# Patient Record
Sex: Female | Born: 1990 | Race: White | Hispanic: No | Marital: Married | Smoking: Former smoker
Health system: Southern US, Community
[De-identification: ages and names within clinical notes are randomized; demographics above are authoritative.]

## PROBLEM LIST (undated history)

## (undated) ENCOUNTER — Inpatient Hospital Stay (HOSPITAL_COMMUNITY): Payer: Self-pay

## (undated) DIAGNOSIS — E282 Polycystic ovarian syndrome: Secondary | ICD-10-CM

## (undated) DIAGNOSIS — F419 Anxiety disorder, unspecified: Secondary | ICD-10-CM

## (undated) DIAGNOSIS — D649 Anemia, unspecified: Secondary | ICD-10-CM

## (undated) DIAGNOSIS — Z8759 Personal history of other complications of pregnancy, childbirth and the puerperium: Secondary | ICD-10-CM

## (undated) DIAGNOSIS — E119 Type 2 diabetes mellitus without complications: Secondary | ICD-10-CM

## (undated) DIAGNOSIS — L732 Hidradenitis suppurativa: Secondary | ICD-10-CM

## (undated) DIAGNOSIS — E559 Vitamin D deficiency, unspecified: Secondary | ICD-10-CM

## (undated) DIAGNOSIS — I10 Essential (primary) hypertension: Secondary | ICD-10-CM

## (undated) DIAGNOSIS — K219 Gastro-esophageal reflux disease without esophagitis: Secondary | ICD-10-CM

## (undated) HISTORY — DX: Vitamin D deficiency, unspecified: E55.9

## (undated) HISTORY — DX: Type 2 diabetes mellitus without complications: E11.9

## (undated) HISTORY — DX: Gastro-esophageal reflux disease without esophagitis: K21.9

## (undated) HISTORY — PX: CHOLECYSTECTOMY: SHX55

## (undated) HISTORY — DX: Hidradenitis suppurativa: L73.2

## (undated) HISTORY — DX: Polycystic ovarian syndrome: E28.2

## (undated) HISTORY — DX: Essential (primary) hypertension: I10

## (undated) HISTORY — DX: Personal history of other complications of pregnancy, childbirth and the puerperium: Z87.59

## (undated) HISTORY — DX: Anemia, unspecified: D64.9

---

## 2018-01-11 ENCOUNTER — Other Ambulatory Visit: Payer: Self-pay

## 2018-01-11 ENCOUNTER — Emergency Department (HOSPITAL_COMMUNITY)
Admission: EM | Admit: 2018-01-11 | Discharge: 2018-01-11 | Disposition: A | Discharge status: Home / Self Care | Payer: BLUE CROSS/BLUE SHIELD | Attending: Emergency Medicine | Admitting: Emergency Medicine

## 2018-01-11 ENCOUNTER — Encounter (HOSPITAL_COMMUNITY): Payer: Self-pay | Admitting: Emergency Medicine

## 2018-01-11 DIAGNOSIS — F17210 Nicotine dependence, cigarettes, uncomplicated: Secondary | ICD-10-CM | POA: Insufficient documentation

## 2018-01-11 DIAGNOSIS — L02415 Cutaneous abscess of right lower limb: Secondary | ICD-10-CM | POA: Insufficient documentation

## 2018-01-11 DIAGNOSIS — N764 Abscess of vulva: Secondary | ICD-10-CM | POA: Insufficient documentation

## 2018-01-11 DIAGNOSIS — R2241 Localized swelling, mass and lump, right lower limb: Secondary | ICD-10-CM | POA: Diagnosis present

## 2018-01-11 DIAGNOSIS — L0291 Cutaneous abscess, unspecified: Secondary | ICD-10-CM

## 2018-01-11 LAB — CBG MONITORING, ED: GLUCOSE-CAPILLARY: 84 mg/dL (ref 65–99)

## 2018-01-11 MED ORDER — FLUCONAZOLE 100 MG PO TABS
200.0000 mg | ORAL_TABLET | Freq: Once | ORAL | Status: AC
  Administered 2018-01-11: 200 mg via ORAL
  Filled 2018-01-11: qty 2

## 2018-01-11 MED ORDER — DOXYCYCLINE HYCLATE 100 MG PO TABS
100.0000 mg | ORAL_TABLET | Freq: Once | ORAL | Status: AC
  Administered 2018-01-11: 100 mg via ORAL
  Filled 2018-01-11: qty 1

## 2018-01-11 MED ORDER — TRAMADOL HCL 50 MG PO TABS
100.0000 mg | ORAL_TABLET | Freq: Once | ORAL | Status: AC
  Administered 2018-01-11: 100 mg via ORAL
  Filled 2018-01-11: qty 2

## 2018-01-11 MED ORDER — DOXYCYCLINE HYCLATE 100 MG PO CAPS: 100.0000 mg | ORAL_CAPSULE | Freq: Two times a day (BID) | ORAL | 0 refills | Status: DC

## 2018-01-11 MED ORDER — CEPHALEXIN 500 MG PO CAPS: 500.0000 mg | ORAL_CAPSULE | Freq: Four times a day (QID) | ORAL | 0 refills | Status: DC

## 2018-01-11 MED ORDER — TRAMADOL HCL 50 MG PO TABS: 50.0000 mg | ORAL_TABLET | Freq: Four times a day (QID) | ORAL | 0 refills | Status: DC | PRN

## 2018-01-11 MED ORDER — PROMETHAZINE HCL 12.5 MG PO TABS
12.5000 mg | ORAL_TABLET | Freq: Once | ORAL | Status: AC
  Administered 2018-01-11: 12.5 mg via ORAL
  Filled 2018-01-11: qty 1

## 2018-01-11 NOTE — ED Provider Notes (Signed)
[facility] EMERGENCY DEPARTMENT Provider Note   CSN: [id number] Arrival date & time: 01/11/18  1855     History   Chief Complaint Chief Complaint  Patient presents with  . Abscess    HPI Candice SidleChristine Campos is a 27 y.o. female.  Patient is a 27 year old female who presents to the emergency department with a complaint of multiple abscess areas.  Patient states she is been having a problem with abscess/boil/cyst for over 2 years.  They have been worse in the last few months.  She presents to the emergency department now because she says the pain is getting worse, and even interfering with her walking.  She has been evaluated by her primary physician as well as her GYN, and they have given her instructions to use an antibiotic soap.  She states that they have not given her any information about why she gets them, and she says the soap does not work.  She notices these lesions on her legs, arms private area, and on the side of her abdomen.  She denies history of diabetes.  She denies ever being told that she had methicillin-resistant staph growth.  She has not had any fever, she has not had chills, she has not had vomiting.  She presents now for assistance with this issue.   The history is provided by the patient.    History reviewed. No pertinent past medical history.  There are no active problems to display for this patient.   Past Surgical History:  Procedure Laterality Date  . CHOLECYSTECTOMY      OB History    Gravida Para Term Preterm AB Living   0 0 0 0 0 0   SAB TAB Ectopic Multiple Live Births   0 0 0 0 0       Home Medications    Prior to Admission medications   Not on File    Family History History reviewed. No pertinent family history.  Social History Social History   Tobacco Use  . Smoking status: Current Every Day Smoker    Packs/day: 0.50    Types: Cigarettes  . Smokeless tobacco: Never Used  Substance Use Topics  . Alcohol use: No   Frequency: Never  . Drug use: No     Allergies   Amoxicillin   Review of Systems Review of Systems  Constitutional: Negative for activity change, chills and fever.       All ROS Neg except as noted in HPI  HENT: Negative for nosebleeds.   Eyes: Negative for photophobia and discharge.  Respiratory: Negative for cough, shortness of breath and wheezing.   Cardiovascular: Negative for chest pain and palpitations.  Gastrointestinal: Negative for abdominal pain and blood in stool.  Genitourinary: Negative for dysuria, frequency and hematuria.  Musculoskeletal: Negative for arthralgias, back pain and neck pain.  Skin: Positive for rash.       Skin lesions  Neurological: Negative for dizziness, seizures and speech difficulty.  Psychiatric/Behavioral: Negative for confusion and hallucinations.     Physical Exam Updated Vital Signs BP 135/75 (BP Location: Right Arm)   Pulse 91   Temp 98.3 F (36.8 C) (Oral)   Resp 14   Ht 5\' 8"  (1.727 m)   Wt (!) 136.5 kg (301 lb)   SpO2 99%   BMI 45.77 kg/m   Physical Exam  Constitutional: She is oriented to person, place, and time. She appears well-developed and well-nourished.  Non-toxic appearance.  HENT:  Head: Normocephalic.  Right Ear: Tympanic  membrane and external ear normal.  Left Ear: Tympanic membrane and external ear normal.  Eyes: EOM and lids are normal. Pupils are equal, round, and reactive to light.  Neck: Normal range of motion. Neck supple. Carotid bruit is not present.  Cardiovascular: Normal rate, regular rhythm, normal heart sounds, intact distal pulses and normal pulses.  Pulmonary/Chest: Breath sounds normal. No respiratory distress.  Abdominal: Soft. Bowel sounds are normal. There is no tenderness. There is no guarding.  Genitourinary:  Genitourinary Comments: Chaperone present during the examination.  The patient has lesions on the right side of the upper thigh.  There are multiple on the pubis and vulva area.  Some  of them are draining purulent material.  There are 3 raised areas that appear to be healing on the inner aspect of the left thigh, and one in which the top is denuded..  There are no red streaks appreciated.  All of these areas are tender to touch.  Musculoskeletal: Normal range of motion.  Lymphadenopathy:       Head (right side): No submandibular adenopathy present.       Head (left side): No submandibular adenopathy present.    She has no cervical adenopathy.  Neurological: She is alert and oriented to person, place, and time. She has normal strength. No cranial nerve deficit or sensory deficit.  Skin: Skin is warm and dry.  Psychiatric: She has a normal mood and affect. Her speech is normal.  Nursing note and vitals reviewed.    ED Treatments / Results  Labs (all labs ordered are listed, but only abnormal results are displayed) Labs Reviewed - No data to display  EKG  EKG Interpretation None       Radiology No results found.  Procedures Procedures (including critical care time)  Medications Ordered in ED Medications - No data to display   Initial Impression / Assessment and Plan / ED Course  I have reviewed the triage vital signs and the nursing notes.  Pertinent labs & imaging results that were available during my care of the patient were reviewed by me and considered in my medical decision making (see chart for details).       Final Clinical Impressions(s) / ED Diagnoses MDM  Vital signs reviewed.  Patient is noted to have multiple abscess areas.  Some of them denuded, and some of them draining.  Patient denies recent fever or chills.  She denies history of diabetes or any other medical condition other than high blood pressure.  Glucose was obtained and found to be within normal limits.  The plan at this time is for the patient to use warm Epson salt soaks.  She is placed on doxycycline and Keflex.  Patient also given medication to assist with her pain and  discomfort.  The patient is referred to Ms. Register for dermatologic evaluation.  Patient is in agreement with this plan.   Final diagnoses:  Abscess of multiple sites    ED Discharge Orders        Ordered    doxycycline (VIBRAMYCIN) 100 MG capsule  2 times daily     01/11/18 2024    cephALEXin (KEFLEX) 500 MG capsule  4 times daily     01/11/18 2024    traMADol (ULTRAM) 50 MG tablet  Every 6 hours PRN     01/11/18 2026       Ivery Quale, PA-C 01/12/18 1610    Bethann Berkshire, MD 01/12/18 1456

## 2018-01-11 NOTE — ED Triage Notes (Signed)
PT c/o multiple raised painful areas to inner legs and pelvic area over the past 2 months. PT states she has seen 2 physicians about the areas and was told to wash with antibacterial soap and it has not been effective.

## 2018-01-11 NOTE — Discharge Instructions (Signed)
Please soak in a tub of warm Epson salt water for 10-15 minutes daily until this problem has resolved.  Please use Keflex with breakfast, lunch, dinner, and at bedtime.  Please use doxycycline 2 times daily with food.  Use Tylenol, and/or ibuprofen for soreness.  May use Ultram for more severe soreness. This medication may cause drowsiness. Please do not drink, drive, or participate in activity that requires concentration while taking this medication.  Please see Ms. Register for dermatology evaluation concerning your lesions.

## 2018-01-15 LAB — AEROBIC CULTURE  (SUPERFICIAL SPECIMEN)
CULTURE: NORMAL
SPECIAL REQUESTS: NORMAL

## 2018-01-15 LAB — AEROBIC CULTURE W GRAM STAIN (SUPERFICIAL SPECIMEN)

## 2018-01-16 ENCOUNTER — Telehealth: Payer: Self-pay | Admitting: Emergency Medicine

## 2018-01-16 NOTE — Telephone Encounter (Signed)
Post ED Visit - Positive Culture Follow-up  Culture report reviewed by antimicrobial stewardship pharmacist:  [x]  Enzo BiNathan Batchelder, Pharm.D. []  Celedonio MiyamotoJeremy Frens, Pharm.D., BCPS AQ-ID []  Garvin FilaMike Maccia, Pharm.D., BCPS []  Georgina PillionElizabeth Martin, [address] BCPS []  [place]es[place]Minh Pham, [address]Pharm[address]D., BCPS, AAHIVP []  Estella HuskMichelle Turner, Pharm.D., BCPS, AAHIVP []  Lysle Pearlachel Rumbarger, PharmD, BCPS []  Blake DivineShannon Parkey, PharmD []  Pollyann SamplesAndy Johnston, PharmD, BCPS  Positive wound culture Treated with doxycycline and cephalexin, organism sensitive to the same and no further patient follow-up is required at this time.  Berle MullMiller, Elayah Klooster 01/16/2018, 11:23 AM

## 2018-03-27 ENCOUNTER — Ambulatory Visit: Payer: Self-pay | Admitting: Family Medicine

## 2018-03-28 ENCOUNTER — Emergency Department (HOSPITAL_COMMUNITY): Payer: BLUE CROSS/BLUE SHIELD

## 2018-03-28 ENCOUNTER — Emergency Department (HOSPITAL_COMMUNITY)
Admission: EM | Admit: 2018-03-28 | Discharge: 2018-03-28 | Disposition: A | Discharge status: Home / Self Care | Payer: BLUE CROSS/BLUE SHIELD | Attending: Emergency Medicine | Admitting: Emergency Medicine

## 2018-03-28 DIAGNOSIS — J069 Acute upper respiratory infection, unspecified: Secondary | ICD-10-CM | POA: Diagnosis not present

## 2018-03-28 DIAGNOSIS — Z79899 Other long term (current) drug therapy: Secondary | ICD-10-CM | POA: Insufficient documentation

## 2018-03-28 DIAGNOSIS — R05 Cough: Secondary | ICD-10-CM | POA: Diagnosis present

## 2018-03-28 DIAGNOSIS — F17210 Nicotine dependence, cigarettes, uncomplicated: Secondary | ICD-10-CM | POA: Diagnosis not present

## 2018-03-28 DIAGNOSIS — B9789 Other viral agents as the cause of diseases classified elsewhere: Secondary | ICD-10-CM

## 2018-03-28 LAB — GROUP A STREP BY PCR: GROUP A STREP BY PCR: NOT DETECTED

## 2018-03-28 MED ORDER — ACETAMINOPHEN 325 MG PO TABS
650.0000 mg | ORAL_TABLET | Freq: Once | ORAL | Status: AC
  Administered 2018-03-28: 650 mg via ORAL
  Filled 2018-03-28: qty 2

## 2018-03-28 MED ORDER — GUAIFENESIN-CODEINE 100-10 MG/5ML PO SYRP: 5.0000 mL | ORAL_SOLUTION | Freq: Three times a day (TID) | ORAL | 0 refills | Status: DC | PRN

## 2018-03-28 NOTE — ED Triage Notes (Signed)
Pt complains of shortness of breath and sore throat since yesterday, cough and pain in abdomen since this morning.

## 2018-03-28 NOTE — ED Provider Notes (Signed)
[facility]-EMERGENCY DEPT Provider Note   CSN: [id number] Arrival date & time: 03/28/18  0707     History   Chief Complaint Chief Complaint  Patient presents with  . Shortness of Breath  . Cough    HPI Candice Campos is a 27 y.o. female.  HPI   Candice Campos is a 27 year old female with a history of hypertension who presents to the emergency department for evaluation of cough, sore throat and bilateral anterior rib pain.  Patient reports that her symptoms began yesterday.  Reports that her cough is nonproductive.  Feels like she has been wheezing.  States that she has bilateral lower rib pain with cough and deep inspiration.  Reports that she feels short of breath with coughing.  She also reports that she developed a sore throat which is about a 5/10 in severity and feels scratchy in nature.  Pain is worsened with coughing or swallowing.  She states that she is felt hot and cold, but denies measured fever.  She denies sick contacts with similar symptoms.  Tried taking DayQil last night without improvement.  She denies congestion, rhinorrhea, headache, ear pain, neck pain/stiffness, dysphasia, abdominal pain, nausea/vomiting, diarrhea, chest pain.  No history of DVT/PE, recent surgery or immobilization, exogenous estrogen or leg swelling/calf tenderness.   No past medical history on file.  There are no active problems to display for this patient.   Past Surgical History:  Procedure Laterality Date  . CHOLECYSTECTOMY       OB History    Gravida  0   Para  0   Term  0   Preterm  0   AB  0   Living  0     SAB  0   TAB  0   Ectopic  0   Multiple  0   Live Births  0            Home Medications    Prior to Admission medications   Medication Sig Start Date End Date Taking? Authorizing Provider  cephALEXin (KEFLEX) 500 MG capsule Take 1 capsule (500 mg total) by mouth 4 (four) times daily. 01/11/18   Ivery Quale, PA-C  doxycycline  (VIBRAMYCIN) 100 MG capsule Take 1 capsule (100 mg total) by mouth 2 (two) times daily. 01/11/18   Ivery Quale, PA-C  traMADol (ULTRAM) 50 MG tablet Take 1 tablet (50 mg total) by mouth every 6 (six) hours as needed. 01/11/18   Ivery Quale, PA-C    Family History No family history on file.  Social History Social History   Tobacco Use  . Smoking status: Current Every Day Smoker    Packs/day: 0.50    Types: Cigarettes  . Smokeless tobacco: Never Used  Substance Use Topics  . Alcohol use: No    Frequency: Never  . Drug use: No     Allergies   Amoxicillin   Review of Systems Review of Systems  Constitutional: Negative for chills and fever.  HENT: Positive for sore throat. Negative for congestion, ear pain, rhinorrhea and trouble swallowing.   Respiratory: Positive for cough, shortness of breath and wheezing.   Cardiovascular: Positive for chest pain (anterior lower chest wall pain with cough).  Gastrointestinal: Negative for abdominal pain, nausea and vomiting.  Genitourinary: Negative for dysuria.  Musculoskeletal: Negative for arthralgias.  Skin: Negative for rash.  Neurological: Negative for headaches.  Psychiatric/Behavioral: Negative for agitation.     Physical Exam Updated Vital Signs BP 127/78   Pulse 91  Temp 98.1 F (36.7 C) (Oral)   Resp 18   LMP 03/20/2018   SpO2 97%   Physical Exam  Constitutional: She appears well-developed and well-nourished. No distress.  HENT:  Head: Normocephalic and atraumatic.  Posterior oropharynx appears erythematous.  Bilateral 2+ tonsillar edema, no exudate.  Uvula midline.  Airway patent and able to handle oral secretions.  Clear rhinorrhea noted in bilateral nares.  Tender bilateral anterior cervical chain adenopathy.  Eyes: Pupils are equal, round, and reactive to light. Conjunctivae are normal. Right eye exhibits no discharge. Left eye exhibits no discharge.  Neck: Normal range of motion. Neck supple.    Cardiovascular: Normal rate, regular rhythm and intact distal pulses. Exam reveals no friction rub.  No murmur heard. Pulmonary/Chest: Effort normal and breath sounds normal. No stridor. No respiratory distress. She has no wheezes. She has no rales.  Tender to palpation over bilateral lower ribs as depicted in image.  No overlying erythema or rash.  No respiratory distress, speaking in full sentences.  Dry cough present.  Good air movement.  No wheezes, rales or rhonchi.    Abdominal: Soft. Bowel sounds are normal. There is no tenderness.  Musculoskeletal:  No leg swelling or calf tenderness.  Neurological: She is alert. Coordination normal.  Skin: Skin is warm and dry. Capillary refill takes less than 2 seconds. She is not diaphoretic.  Psychiatric: She has a normal mood and affect. Her behavior is normal.  Nursing note and vitals reviewed.    ED Treatments / Results  Labs (all labs ordered are listed, but only abnormal results are displayed) Labs Reviewed  GROUP A STREP BY PCR    EKG None  Radiology No results found.  Procedures Procedures (including critical care time)  Medications Ordered in ED Medications  acetaminophen (TYLENOL) tablet 650 mg (650 mg Oral Given 03/28/18 0751)     Initial Impression / Assessment and Plan / ED Course  I have reviewed the triage vital signs and the nursing notes.  Pertinent labs & imaging results that were available during my care of the patient were reviewed by me and considered in my medical decision making (see chart for details).    Chest x-ray negative for acute abnormality.  Rapid strep test negative.  Patient's symptoms are consistent with viral URI.  In terms of her bilateral lower anterior chest wall pain, this is reproducible to palpation and is likely MSK related to her recent cough.  No abdominal tenderness on exam.  Patient is PERC negative and do not suspect PE. Discussed that antibiotics are not indicated for viral  infections. Pt will be discharged with symptomatic treatment. Patient verbalizes understanding and is agreeable with plan. Discussed reasons to return to the ED and she agrees. Pt is hemodynamically stable & in NAD prior to dc.  Final Clinical Impressions(s) / ED Diagnoses   Final diagnoses:  Viral URI with cough    ED Discharge Orders        Ordered    guaiFENesin-codeine [facility] AC) 100-10 MG/5ML syrup  3 times daily PRN     03/28/18 0825       Kellie Shropshire, PA-C 03/28/18 1914    Derwood Kaplan, MD 03/28/18 [phone]

## 2018-03-28 NOTE — Discharge Instructions (Addendum)
Your chest x-ray was reassuring.  You do not have strep throat.  I have written you a prescription for cough medicine with codeine.  This medicine can make you drowsy so please do not drive or work while taking it.  Please take Tylenol for the pain over your ribs.  Return to the ER if you have any new or concerning symptoms like not being able to catch your breath.

## 2020-03-11 IMAGING — CR DG CHEST 2V
2 series · 2 of 2 positions shown · non-contrast
Comparison: None.

CLINICAL DATA: Cough and congestion

EXAM:
CHEST - 2 VIEW

[w chest pa]
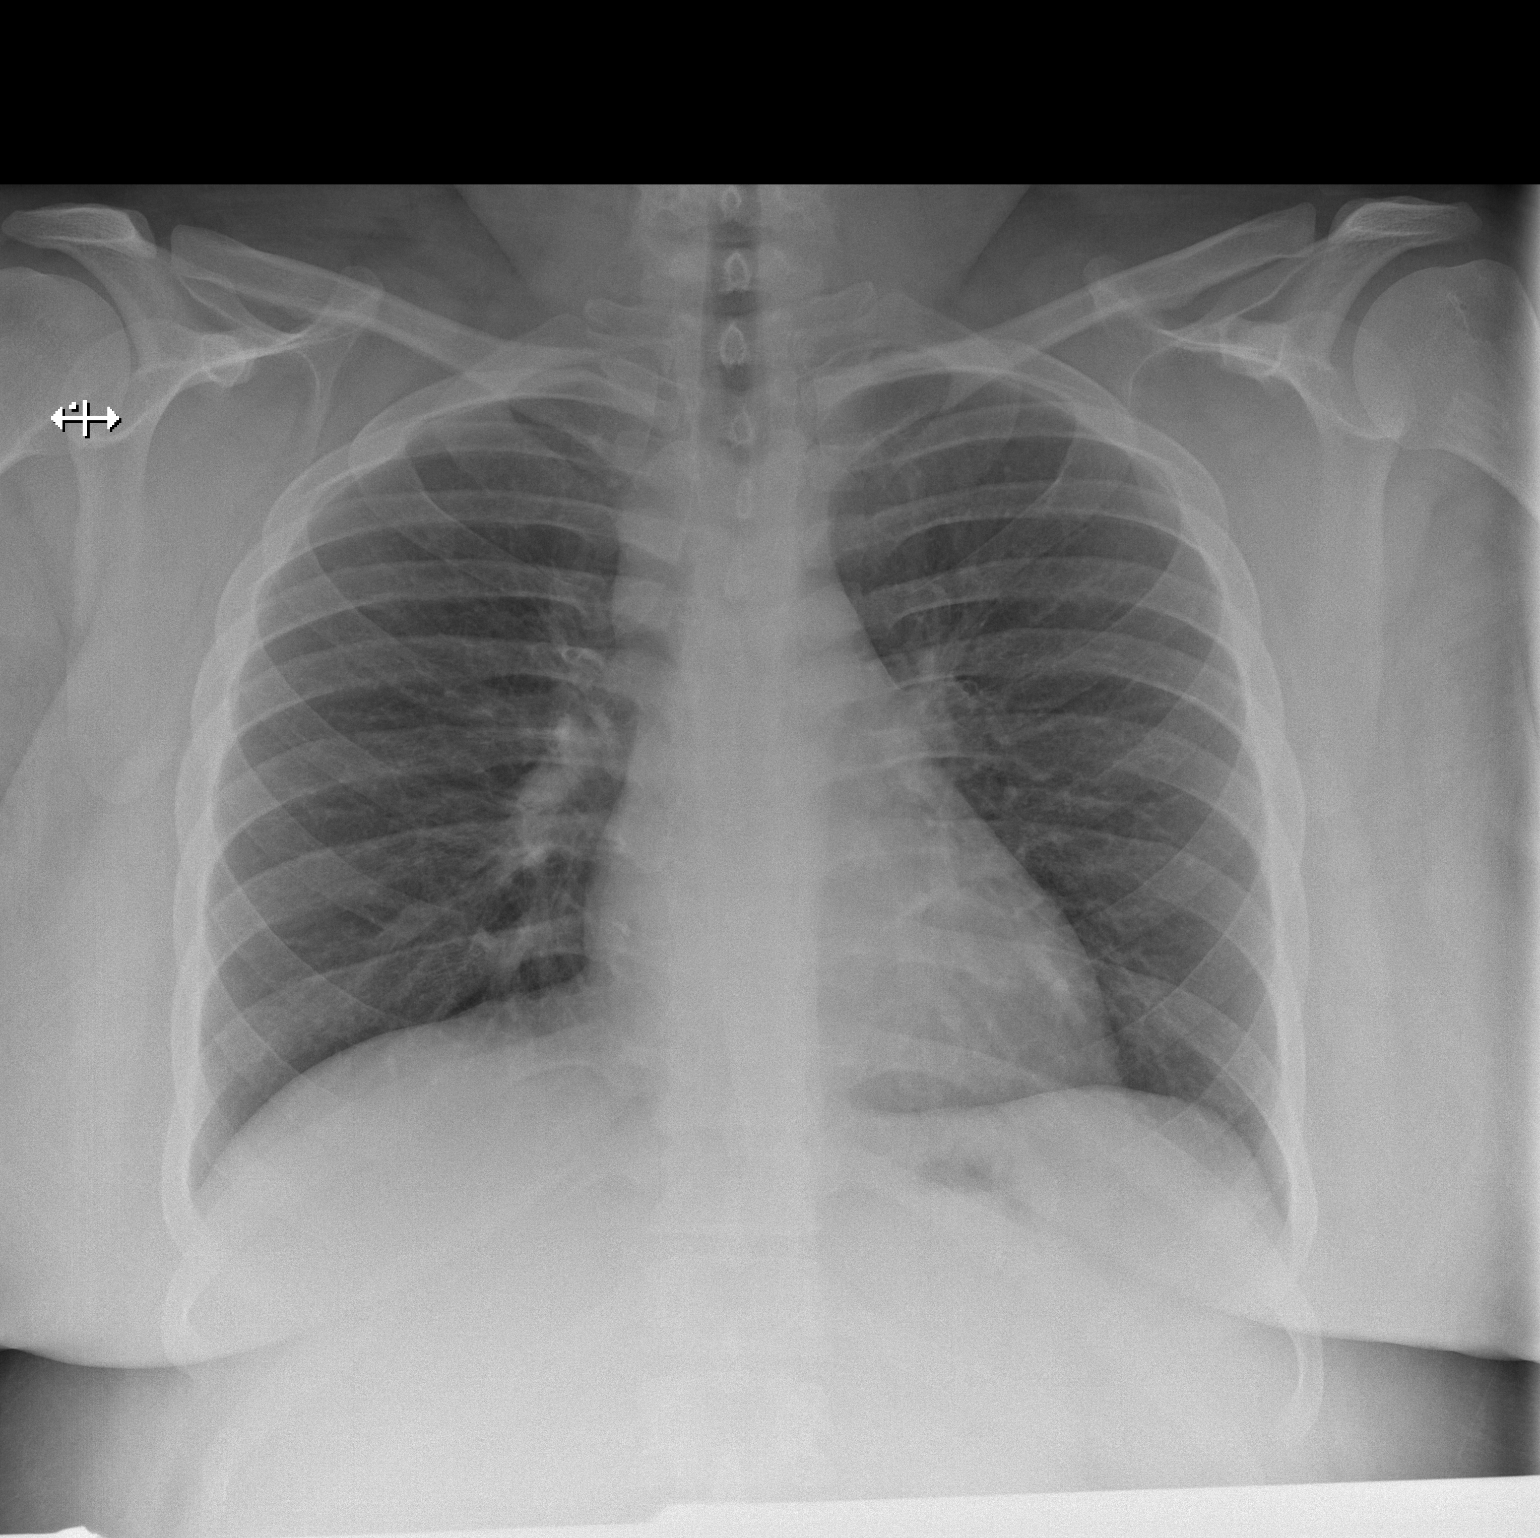

[w chest lat]
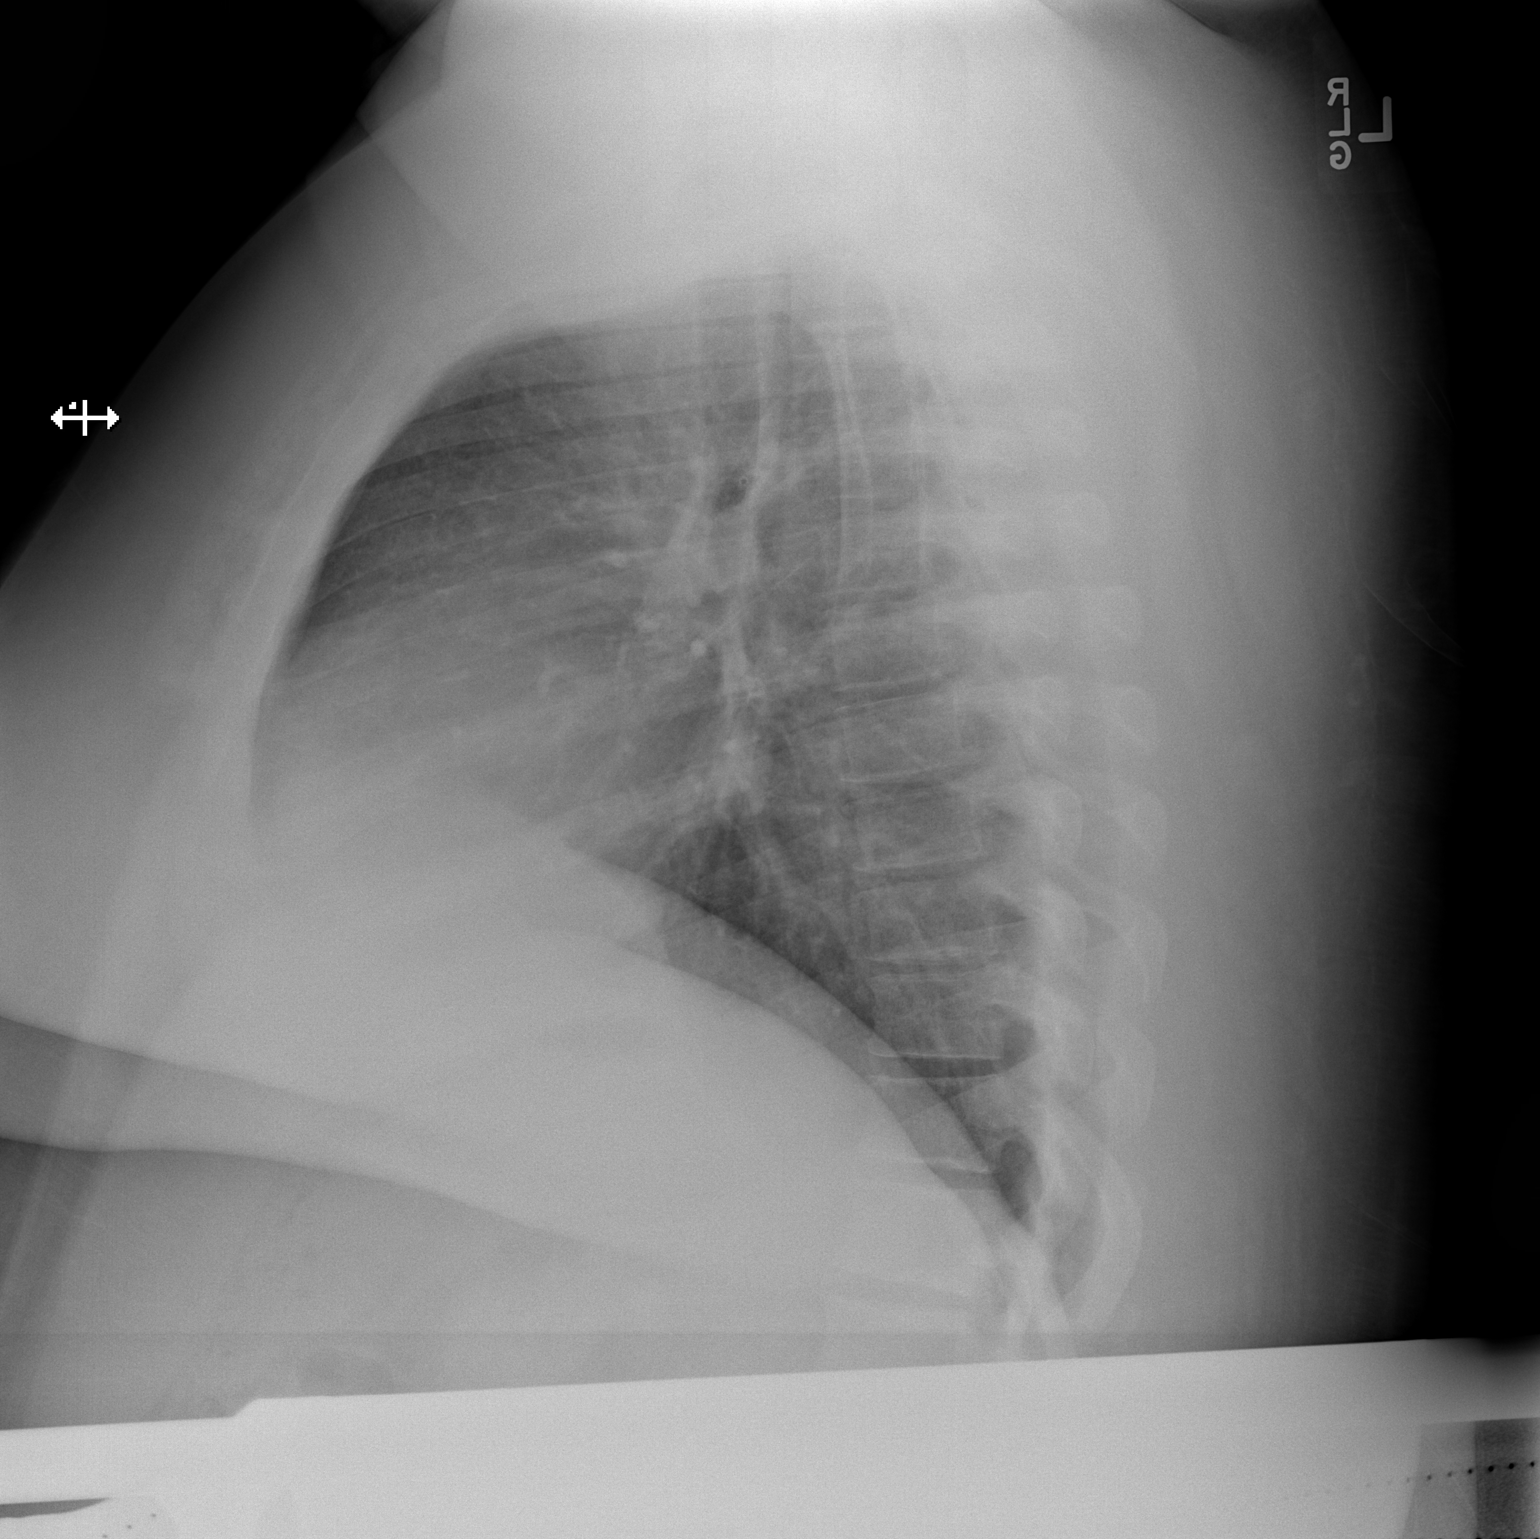

[2 of 2 positions shown; findings below may reference images not displayed]

FINDINGS: There is no edema or consolidation. The heart size and pulmonary
vascularity are normal. No adenopathy. No pneumothorax. No bone
lesions.
IMPRESSION: No edema or consolidation.

## 2021-07-06 DIAGNOSIS — Z8759 Personal history of other complications of pregnancy, childbirth and the puerperium: Secondary | ICD-10-CM | POA: Insufficient documentation

## 2021-07-06 DIAGNOSIS — F321 Major depressive disorder, single episode, moderate: Secondary | ICD-10-CM | POA: Insufficient documentation

## 2021-07-06 DIAGNOSIS — E559 Vitamin D deficiency, unspecified: Secondary | ICD-10-CM | POA: Insufficient documentation

## 2021-07-06 DIAGNOSIS — E119 Type 2 diabetes mellitus without complications: Secondary | ICD-10-CM | POA: Insufficient documentation

## 2021-07-06 DIAGNOSIS — L732 Hidradenitis suppurativa: Secondary | ICD-10-CM | POA: Insufficient documentation

## 2021-07-06 DIAGNOSIS — I10 Essential (primary) hypertension: Secondary | ICD-10-CM | POA: Insufficient documentation

## 2021-07-06 DIAGNOSIS — E282 Polycystic ovarian syndrome: Secondary | ICD-10-CM | POA: Insufficient documentation

## 2021-11-01 ENCOUNTER — Other Ambulatory Visit: Payer: Self-pay | Admitting: Obstetrics & Gynecology

## 2021-11-01 DIAGNOSIS — O3680X0 Pregnancy with inconclusive fetal viability, not applicable or unspecified: Secondary | ICD-10-CM

## 2021-11-02 ENCOUNTER — Other Ambulatory Visit: Payer: Self-pay | Admitting: Obstetrics & Gynecology

## 2021-11-02 ENCOUNTER — Other Ambulatory Visit: Payer: Self-pay

## 2021-11-02 ENCOUNTER — Ambulatory Visit (INDEPENDENT_AMBULATORY_CARE_PROVIDER_SITE_OTHER): Payer: BC Managed Care – PPO

## 2021-11-02 DIAGNOSIS — O3680X0 Pregnancy with inconclusive fetal viability, not applicable or unspecified: Secondary | ICD-10-CM | POA: Diagnosis not present

## 2021-11-02 DIAGNOSIS — Z3A09 9 weeks gestation of pregnancy: Secondary | ICD-10-CM | POA: Diagnosis not present

## 2021-11-02 NOTE — Progress Notes (Signed)
[place] TA/TV: 9 wks,single IUP with YS,CRL 19.21 mm,FHR 173 bpm,normal left ovary,solid right adnexal mass with color flow 5.5 x 3.6 x 5.8 cm,? Pedunculated fibroid vs enlarged right ovary

## 2021-12-02 ENCOUNTER — Encounter: Payer: Self-pay | Admitting: Women's Health

## 2021-12-02 DIAGNOSIS — O0990 Supervision of high risk pregnancy, unspecified, unspecified trimester: Secondary | ICD-10-CM | POA: Insufficient documentation

## 2021-12-03 ENCOUNTER — Telehealth: Payer: Self-pay | Admitting: *Deleted

## 2021-12-03 ENCOUNTER — Other Ambulatory Visit: Payer: Self-pay | Admitting: Obstetrics & Gynecology

## 2021-12-03 DIAGNOSIS — Z3682 Encounter for antenatal screening for nuchal translucency: Secondary | ICD-10-CM

## 2021-12-03 NOTE — Telephone Encounter (Signed)
Pt is having pain in stomach. Also has a brownish discharge. This is happening off and on. Pt had a BM today and had sex 1 week ago. Pt states when she passes gas, she feels better. Pt was advised can get Gas-x OTC. She has an appt on 1/9. Pt was advised to keep that appt. Pt voiced understanding. [place]

## 2021-12-06 ENCOUNTER — Encounter: Payer: Self-pay | Admitting: Women's Health

## 2021-12-06 ENCOUNTER — Ambulatory Visit (INDEPENDENT_AMBULATORY_CARE_PROVIDER_SITE_OTHER): Payer: BC Managed Care – PPO | Admitting: Women's Health

## 2021-12-06 ENCOUNTER — Ambulatory Visit: Payer: BC Managed Care – PPO | Admitting: *Deleted

## 2021-12-06 ENCOUNTER — Other Ambulatory Visit: Payer: BC Managed Care – PPO

## 2021-12-06 ENCOUNTER — Other Ambulatory Visit: Payer: Self-pay

## 2021-12-06 VITALS — BP 132/73 | HR 85 | Ht 69.0 in | Wt 281.0 lb

## 2021-12-06 DIAGNOSIS — Z8639 Personal history of other endocrine, nutritional and metabolic disease: Secondary | ICD-10-CM | POA: Diagnosis not present

## 2021-12-06 DIAGNOSIS — Z5189 Encounter for other specified aftercare: Secondary | ICD-10-CM | POA: Diagnosis not present

## 2021-12-06 DIAGNOSIS — O0991 Supervision of high risk pregnancy, unspecified, first trimester: Secondary | ICD-10-CM

## 2021-12-06 DIAGNOSIS — O039 Complete or unspecified spontaneous abortion without complication: Secondary | ICD-10-CM | POA: Diagnosis not present

## 2021-12-06 NOTE — Patient Instructions (Signed)
Continue prenatal vitamins Let me know when your next period starts

## 2021-12-06 NOTE — Progress Notes (Signed)
° °  GYN VISIT Patient name: Candice Campos MRN [id number]  Date of birth: August 30, 1991 Chief Complaint:   Follow-up and Miscarriage  History of Present Illness:   Candice Campos is a 31 y.o. G3P0010 Caucasian female being seen today for f/u after SAB on 1/7 at [facility] at [redacted]w[redacted]d. Had started having brown d/c and intermittent abd pain on 1/5, continued for a couple of days, then on 1/7 felt pop and had large amt of blood so she went to the hospital, where she spontaneously passed the baby. Had been trying for pregnancy x 54yrs, was dx w/ PCOS, was rx'd phentermine and topamax, then monjaro and lost a significant amount of weight, and periods started to regulate. She was also rx'd metformin, then clomid x and conceived. States A1C at highest was 6.9, however she was never dx w/ DM. Last A1C 5.6 on 07/06/21. Still taking pnv. Does want to try for another pregnancy soon.  Patient's last menstrual period was 12/03/2021. The current method of family planning is abstinence.  Last pap 2022. Results were: negative per pt report at [facility]  Review of Systems:   Pertinent items are noted in HPI Denies fever/chills, dizziness, headaches, visual disturbances, fatigue, shortness of breath, chest pain, abdominal pain, vomiting, abnormal vaginal discharge/itching/odor/irritation, problems with periods, bowel movements, urination, or intercourse unless otherwise stated above.  Pertinent History Reviewed:  Reviewed past medical,surgical, social, obstetrical and family history.  Reviewed problem list, medications and allergies. Physical Assessment:   Vitals:   12/06/21 1041  BP: 132/73  Pulse: 85  Weight: 281 lb (127.5 kg)  Height: 5\' 9"  (1.753 m)  Body mass index is 41.5 kg/m.       Physical Examination:   General appearance: alert, well appearing, and in no distress  Mental status: alert, oriented to person, place, and time  Skin: warm & dry   Cardiovascular: normal heart rate  noted  Respiratory: normal respiratory effort, no distress  Abdomen: soft, non-tender   Pelvic: examination not indicated  Extremities: no edema   Chaperone: N/A    No results found for this or any previous visit (from the past 24 hour(s)).  Assessment & Plan:  1) F/u after SAB @ [redacted]w[redacted]d> wants to get pregnant again soon. Had to take clomid x 1 cycle. On metformin for PCOS. To send me a mychart message when next period starts and will rx clomid. Continue pnv.   2) H/O DM> check A1C today, on metformin for PCOS  Meds: No orders of the defined types were placed in this encounter.   Orders Placed This Encounter  Procedures   Hemoglobin A1c    Return for get pap from [facility] please;print work note; no f/u visit right now.  [redacted]w[redacted]d CNM, [facility] 12/06/2021 11:17 AM

## 2021-12-07 ENCOUNTER — Encounter: Payer: Self-pay | Admitting: Women's Health

## 2021-12-07 LAB — HEMOGLOBIN A1C
Est. average glucose Bld gHb Est-mCnc: 131 mg/dL
Hgb A1c MFr Bld: 6.2 % — ABNORMAL HIGH (ref 4.8–5.6)

## 2021-12-17 ENCOUNTER — Encounter: Payer: Self-pay | Admitting: Women's Health

## 2021-12-17 DIAGNOSIS — Z124 Encounter for screening for malignant neoplasm of cervix: Secondary | ICD-10-CM | POA: Insufficient documentation

## 2022-04-08 ENCOUNTER — Other Ambulatory Visit: Payer: Self-pay

## 2022-04-08 ENCOUNTER — Emergency Department (HOSPITAL_COMMUNITY): Payer: Medicaid Other

## 2022-04-08 ENCOUNTER — Encounter (HOSPITAL_COMMUNITY): Payer: Self-pay

## 2022-04-08 ENCOUNTER — Emergency Department (HOSPITAL_COMMUNITY)
Admission: EM | Admit: 2022-04-08 | Discharge: 2022-04-08 | Disposition: A | Discharge status: Home / Self Care | Payer: Medicaid Other | Attending: Emergency Medicine | Admitting: Emergency Medicine

## 2022-04-08 DIAGNOSIS — E119 Type 2 diabetes mellitus without complications: Secondary | ICD-10-CM | POA: Insufficient documentation

## 2022-04-08 DIAGNOSIS — Z79899 Other long term (current) drug therapy: Secondary | ICD-10-CM | POA: Insufficient documentation

## 2022-04-08 DIAGNOSIS — R0789 Other chest pain: Secondary | ICD-10-CM | POA: Diagnosis not present

## 2022-04-08 DIAGNOSIS — F419 Anxiety disorder, unspecified: Secondary | ICD-10-CM | POA: Diagnosis not present

## 2022-04-08 DIAGNOSIS — Z7984 Long term (current) use of oral hypoglycemic drugs: Secondary | ICD-10-CM | POA: Insufficient documentation

## 2022-04-08 DIAGNOSIS — R42 Dizziness and giddiness: Secondary | ICD-10-CM | POA: Diagnosis not present

## 2022-04-08 DIAGNOSIS — R110 Nausea: Secondary | ICD-10-CM | POA: Insufficient documentation

## 2022-04-08 DIAGNOSIS — I10 Essential (primary) hypertension: Secondary | ICD-10-CM | POA: Diagnosis not present

## 2022-04-08 DIAGNOSIS — R0602 Shortness of breath: Secondary | ICD-10-CM | POA: Insufficient documentation

## 2022-04-08 LAB — CBC
HCT: 37.8 % (ref 36.0–46.0)
Hemoglobin: 12.4 g/dL (ref 12.0–15.0)
MCH: 26.3 pg (ref 26.0–34.0)
MCHC: 32.8 g/dL (ref 30.0–36.0)
MCV: 80.1 fL (ref 80.0–100.0)
Platelets: 343 10*3/uL (ref 150–400)
RBC: 4.72 MIL/uL (ref 3.87–5.11)
RDW: 14.8 % (ref 11.5–15.5)
WBC: 7.9 10*3/uL (ref 4.0–10.5)
nRBC: 0 % (ref 0.0–0.2)

## 2022-04-08 LAB — BASIC METABOLIC PANEL
Anion gap: 7 (ref 5–15)
BUN: 9 mg/dL (ref 6–20)
CO2: 22 mmol/L (ref 22–32)
Calcium: 9 mg/dL (ref 8.9–10.3)
Chloride: 109 mmol/L (ref 98–111)
Creatinine, Ser: 0.69 mg/dL (ref 0.44–1.00)
GFR, Estimated: >60 mL/min (ref >60)
Glucose, Bld: 101 mg/dL — ABNORMAL HIGH (ref 70–99)
Potassium: 3.9 mmol/L (ref 3.5–5.1)
Sodium: 138 mmol/L (ref 135–145)

## 2022-04-08 LAB — POC URINE PREG, ED: Preg Test, Ur: NEGATIVE

## 2022-04-08 LAB — TROPONIN I (HIGH SENSITIVITY): Troponin I (High Sensitivity): <2 ng/L (ref <18)

## 2022-04-08 LAB — D-DIMER, QUANTITATIVE: D-Dimer, Quant: 0.39 ug/mL-FEU (ref 0.00–0.50)

## 2022-04-08 MED ORDER — LORAZEPAM 1 MG PO TABS
1.0000 mg | ORAL_TABLET | Freq: Once | ORAL | Status: AC
  Administered 2022-04-08: 1 mg via ORAL
  Filled 2022-04-08: qty 1

## 2022-04-08 NOTE — ED Provider Notes (Signed)
?[facility] EMERGENCY DEPARTMENT ?Provider Note ? ? ?CSN: [id number] ?Arrival date & time: 04/08/22  [id number] ? ?  ? ?History ? ?Chief Complaint  ?Patient presents with  ? Chest Pain  ? ? ?Candice Campos is a 31 y.o. female. ? ? ?Chest Pain ?Associated symptoms: dizziness and shortness of breath   ?Associated symptoms: no abdominal pain, no back pain, no fever, no headache, no nausea, no vomiting and no weakness   ? ?  ? ? ?Candice Campos is a 31 y.o. female with past medical history of type 2 diabetes, hypertension, PCOS, who presents to the Emergency Department complaining of intermittent chest tightness x2 weeks.  States that she was seen at another ER facility and diagnosed with bronchitis.  She completed a course of antibiotics and received a injection of steroids.  Since receiving the steroids she notes having some intermittent dizziness nausea, shortness of breath, restlessness, and difficulty sleeping.  States her chest tightness became more severe 2 hours prior to arrival which prompted her ER visit today.  She denies any syncope, visual changes, neck pain or stiffness, fever or chills.  No abdominal pain nausea or vomiting. ? ? ? ?Home Medications ?Prior to Admission medications   ?Medication Sig Start Date End Date Taking? Authorizing Provider  ?labetalol (NORMODYNE) 100 MG tablet Take 100 mg by mouth daily. 11/12/21   [provider]  ?metFORMIN (GLUCOPHAGE) 500 MG tablet Take 500 mg by mouth 2 (two) times daily. 09/29/21   [provider]  ?   ? ?Allergies    ?Amoxicillin   ? ?Review of Systems   ?Review of Systems  ?Constitutional:  Negative for appetite change, chills and fever.  ?Eyes:  Negative for visual disturbance.  ?Respiratory:  Positive for shortness of breath.   ?Cardiovascular:  Positive for chest pain.  ?Gastrointestinal:  Negative for abdominal pain, nausea and vomiting.  ?Genitourinary:  Negative for difficulty urinating.  ?Musculoskeletal:  Negative for back pain, neck  pain and neck stiffness.  ?Neurological:  Positive for dizziness. Negative for seizures, syncope, weakness and headaches.  ?Psychiatric/Behavioral:  The patient is nervous/anxious.   ? ?Physical Exam ?Updated Vital Signs ?BP 113/68   Pulse 89   Temp 98.3 ?F (36.8 ?C)   Resp 13   Ht 5\' 9"  (1.753 m)   Wt 126.6 kg   SpO2 99%   BMI 41.20 kg/m?  ?Physical Exam ?Vitals and nursing note reviewed.  ?Constitutional:   ?   General: She is not in acute distress. ?   Appearance: She is well-developed. She is obese. She is not toxic-appearing or diaphoretic.  ?   Comments: Patient is slightly anxious appearing.  ?HENT:  ?   Mouth/Throat:  ?   Mouth: Mucous membranes are moist.  ?Eyes:  ?   Extraocular Movements: Extraocular movements intact.  ?   Pupils: Pupils are equal, round, and reactive to light.  ?Cardiovascular:  ?   Rate and Rhythm: Normal rate and regular rhythm.  ?   Pulses: Normal pulses.  ?Pulmonary:  ?   Effort: Pulmonary effort is normal.  ?Abdominal:  ?   Palpations: Abdomen is soft.  ?   Tenderness: There is no abdominal tenderness.  ?Musculoskeletal:  ?   Right lower leg: No edema.  ?   Left lower leg: No edema.  ?Skin: ?   General: Skin is warm.  ?   Capillary Refill: Capillary refill takes less than 2 seconds.  ?   Findings: No rash.  ?Neurological:  ?  General: No focal deficit present.  ?   Mental Status: She is alert.  ?   Sensory: No sensory deficit.  ?   Motor: No weakness.  ? ? ?ED Results / Procedures / Treatments   ?Labs ?(all labs ordered are listed, but only abnormal results are displayed) ?Labs Reviewed  ?BASIC METABOLIC PANEL - Abnormal; Notable for the following components:  ?    Result Value  ? Glucose, Bld 101 (*)   ? All other components within normal limits  ?CBC  ?D-DIMER, QUANTITATIVE  ?POC URINE PREG, ED  ?TROPONIN I (HIGH SENSITIVITY)  ? ? ?EKG ?EKG Interpretation ? ?Date/Time:  Friday Apr 08 2022 10:09:23 EDT ?Ventricular Rate:  76 ?PR Interval:  148 ?QRS Duration: 86 ?QT  Interval:  390 ?QTC Calculation: 439 ?R Axis:     ?Text Interpretation: EASI Derived Leads Normal sinus rhythm No previous ECGs available Confirmed by Vanetta Mulders [id number]) on 04/08/2022 10:58:15 AM ? ?Radiology ?DG Chest 2 View ? ?Result Date: 04/08/2022 ?CLINICAL DATA:  Chest pain for the past 2 weeks. EXAM: CHEST - 2 VIEW COMPARISON:  03/28/2018 FINDINGS: Examination is degraded due to patient body habitus. Grossly unchanged cardiac silhouette and mediastinal contours. Thickening the right paratracheal stripe is unchanged and presumably secondary to prominent vasculature. No discrete focal airspace opacities. No pleural effusion or pneumothorax. No evidence of edema. No acute osseous abnormalities. IMPRESSION: No acute cardiopulmonary disease on this body habitus degraded examination. Electronically Signed   By: Simonne Come M.D.   On: 04/08/2022 11:16   ? ?Procedures ?Procedures  ? ? ?Medications Ordered in ED ?Medications - No data to display ? ?ED Course/ Medical Decision Making/ A&P ?  ?                        ?Medical Decision Making ?Patient here for evaluation of chest pain intermittent x2 weeks.  Recently diagnosed with bronchitis.  Received steroid injection and antibiotics which she has completed.  Notes having dizziness, shortness of breath and feelings of restlessness since receiving the steroid injection.  Some substernal chest pain that became worse this morning prior to arrival.  This prompted her ER visit today.  No history of ACS PE or DVT. ? ?On exam, patient slightly anxious appearing.  No focal neurodeficits.  Chest pain has somewhat improved since ER arrival.  Differential would include ACS and PE.  Her vital signs are reassuring.  Will obtain labs, EKG and chest x-ray. ? ?Amount and/or Complexity of Data Reviewed ?External Data Reviewed: notes. ?   Details: Prior medical records reviewed by me ?Labs: ordered. ?   Details: Labs interpreted by me, no evidence of leukocytosis or anemia.   Chemistries unremarkable.  Troponin reassuring.  Urine pregnancy test negative and D-dimer unremarkable. ?Radiology: ordered. ?   Details: Chest x-ray without acute cardiopulmonary process. ?ECG/medicine tests: ordered. ?   Details: EKG reviewed by Dr. Deretha Emory.  Normal sinus rhythm. ? ?Risk ?Prescription drug management. ? ? ?Patient here for evaluation of symptoms after receiving steroid shot.  Having intermittent chest pain that was worse this morning.  No history of PE or cardiac process. ? ?I suspect patient's symptoms are related to anxiety or possibly secondary to receiving steroids.  She states she has never had steroids before. ?Her work-up today is reassuring.  I do not suspect PE or ACS.  She was given oral lorazepam with significant improvement.  She reports feeling much better.  Patient was reassured.  I  feel she is appropriate for discharge home and she will follow-up closely with PCP.  Return precautions were discussed. ? ? ? ?Document critical care time when appropriate:1} ? ? ? ? ? ?Final Clinical Impression(s) / ED Diagnoses ?Final diagnoses:  ?Anxiety  ? ? ?Rx / DC Orders ?ED Discharge Orders   ? ? None  ? ?  ? ? ?  ?Pauline Aus, PA-C ?04/10/22 1348 ? ?  ?Vanetta Mulders, MD ?04/13/22 1614 ? ?

## 2022-04-08 NOTE — ED Triage Notes (Signed)
Patient reports chest tightness for the past couple weeks, seen in ED for same and diagnosed with Bronchitis. States that pain is severe in the past two hours. States that she is having dizziness with SOB and nausea since steroid shot. ?

## 2022-04-08 NOTE — Discharge Instructions (Signed)
Your work-up today is reassuring.  Follow-up with your primary care provider for recheck or return emergency department for any new or worsening symptoms. ?

## 2022-05-09 ENCOUNTER — Emergency Department (HOSPITAL_COMMUNITY): Payer: Medicaid Other

## 2022-05-09 ENCOUNTER — Other Ambulatory Visit: Payer: Self-pay

## 2022-05-09 ENCOUNTER — Emergency Department (HOSPITAL_COMMUNITY)
Admission: EM | Admit: 2022-05-09 | Discharge: 2022-05-10 | Disposition: A | Discharge status: Home / Self Care | Payer: Medicaid Other | Attending: Emergency Medicine | Admitting: Emergency Medicine

## 2022-05-09 ENCOUNTER — Encounter (HOSPITAL_COMMUNITY): Payer: Self-pay | Admitting: *Deleted

## 2022-05-09 DIAGNOSIS — M25512 Pain in left shoulder: Secondary | ICD-10-CM | POA: Diagnosis present

## 2022-05-09 DIAGNOSIS — R519 Headache, unspecified: Secondary | ICD-10-CM | POA: Diagnosis not present

## 2022-05-09 DIAGNOSIS — R079 Chest pain, unspecified: Secondary | ICD-10-CM | POA: Insufficient documentation

## 2022-05-09 LAB — BASIC METABOLIC PANEL
Anion gap: 7 (ref 5–15)
BUN: 13 mg/dL (ref 6–20)
CO2: 25 mmol/L (ref 22–32)
Calcium: 9.2 mg/dL (ref 8.9–10.3)
Chloride: 107 mmol/L (ref 98–111)
Creatinine, Ser: 0.77 mg/dL (ref 0.44–1.00)
GFR, Estimated: >60 mL/min (ref >60)
Glucose, Bld: 109 mg/dL — ABNORMAL HIGH (ref 70–99)
Potassium: 3.7 mmol/L (ref 3.5–5.1)
Sodium: 139 mmol/L (ref 135–145)

## 2022-05-09 LAB — CBC
HCT: 35.4 % — ABNORMAL LOW (ref 36.0–46.0)
Hemoglobin: 11.4 g/dL — ABNORMAL LOW (ref 12.0–15.0)
MCH: 26.2 pg (ref 26.0–34.0)
MCHC: 32.2 g/dL (ref 30.0–36.0)
MCV: 81.4 fL (ref 80.0–100.0)
Platelets: 347 10*3/uL (ref 150–400)
RBC: 4.35 MIL/uL (ref 3.87–5.11)
RDW: 15.1 % (ref 11.5–15.5)
WBC: 9.5 10*3/uL (ref 4.0–10.5)
nRBC: 0 % (ref 0.0–0.2)

## 2022-05-09 LAB — TROPONIN I (HIGH SENSITIVITY): Troponin I (High Sensitivity): <2 ng/L (ref <18)

## 2022-05-09 MED ORDER — KETOROLAC TROMETHAMINE 60 MG/2ML IM SOLN
15.0000 mg | Freq: Once | INTRAMUSCULAR | Status: AC
  Administered 2022-05-09: 15 mg via INTRAMUSCULAR
  Filled 2022-05-09: qty 2

## 2022-05-09 MED ORDER — METHOCARBAMOL 500 MG PO TABS
500.0000 mg | ORAL_TABLET | Freq: Once | ORAL | Status: AC
  Administered 2022-05-09: 500 mg via ORAL
  Filled 2022-05-09: qty 1

## 2022-05-09 NOTE — ED Provider Notes (Incomplete)
  [facility] EMERGENCY DEPARTMENT Provider Note   CSN: [id number] Arrival date & time: 05/09/22  2155     History {Add pertinent medical, surgical, social history, OB history to HPI:1} Chief Complaint  Patient presents with   Chest Pain    Candice Campos is a 31 y.o. female.   Chest Pain      Home Medications Prior to Admission medications   Medication Sig Start Date End Date Taking? Authorizing Provider  labetalol (NORMODYNE) 100 MG tablet Take 100 mg by mouth daily. 11/12/21   [provider]  metFORMIN (GLUCOPHAGE) 500 MG tablet Take 500 mg by mouth 2 (two) times daily. 09/29/21   [provider]      Allergies    Amoxicillin    Review of Systems   Review of Systems  Cardiovascular:  Positive for chest pain.    Physical Exam Updated Vital Signs BP (!) 153/89   Pulse 67   Temp 98.3 F (36.8 C) (Oral)   Resp 16   LMP 04/10/2022   SpO2 98%  Physical Exam  ED Results / Procedures / Treatments   Labs (all labs ordered are listed, but only abnormal results are displayed) Labs Reviewed  CBC - Abnormal; Notable for the following components:      Result Value   Hemoglobin 11.4 (*)    HCT 35.4 (*)    All other components within normal limits  BASIC METABOLIC PANEL  POC URINE PREG, ED  TROPONIN I (HIGH SENSITIVITY)    EKG None  Radiology DG Chest Port 1 View  Result Date: 05/09/2022 CLINICAL DATA:  Left chest pain EXAM: PORTABLE CHEST 1 VIEW COMPARISON:  None Available. FINDINGS: The heart size and mediastinal contours are within normal limits. Both lungs are clear. The visualized skeletal structures are unremarkable. IMPRESSION: No active disease. Electronically Signed   By: Helyn Numbers M.D.   On: 05/09/2022 22:21    Procedures Procedures  {Document cardiac monitor, telemetry assessment procedure when appropriate:1}  Medications Ordered in ED Medications  ketorolac (TORADOL) injection 15 mg (has no administration in time  range)  methocarbamol (ROBAXIN) tablet 500 mg (has no administration in time range)    ED Course/ Medical Decision Making/ A&P                           Medical Decision Making Amount and/or Complexity of Data Reviewed Labs: ordered.  Risk Prescription drug management.   ***  {Document critical care time when appropriate:1} {Document review of labs and clinical decision tools ie heart score, Chads2Vasc2 etc:1}  {Document your independent review of radiology images, and any outside records:1} {Document your discussion with family members, caretakers, and with consultants:1} {Document social determinants of health affecting pt's care:1} {Document your decision making why or why not admission, treatments were needed:1} Final Clinical Impression(s) / ED Diagnoses Final diagnoses:  None    Rx / DC Orders ED Discharge Orders     None

## 2022-05-09 NOTE — ED Provider Notes (Signed)
[facility] EMERGENCY DEPARTMENT Provider Note   CSN: [id number] Arrival date & time: 05/09/22  2155     History  Chief Complaint  Patient presents with   Chest Pain    Candice Campos is a 31 y.o. female.  31 yo F here with a few different symptoms. Sounds like she is mostly here for left shoulder pain that radiates to left neck and left posterior shoulder. Worse with movement. Worse with palpation. Somewhat improved with BC powders at home. Also states she had a headache this morning that doesn't seem to have improved. 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 Also states over the last few weeks or longer she has had a feelng of burnign all over her body. Describes it is a hot flash and sometimes will be anxious. Sometimes will be clammy. Has seen pcp for it. Not sure if it is related to anxiety or possibly recent steroid side effect. No sob, nausea, diarrhea, vomiting at this time. No sick contacts. No fevers.    Chest Pain      Home Medications Prior to Admission medications   Medication Sig Start Date End Date Taking? Authorizing Provider  cyclobenzaprine (FLEXERIL) 10 MG tablet Take 1 tablet (10 mg total) by mouth 2 (two) times daily as needed for muscle spasms. 05/10/22  Yes Ibraheem Voris, Barbara Cower, MD  labetalol (NORMODYNE) 100 MG tablet Take 100 mg by mouth daily. 11/12/21   [provider]  metFORMIN (GLUCOPHAGE) 500 MG tablet Take 500 mg by mouth 2 (two) times daily. 09/29/21   [provider]      Allergies    Amoxicillin    Review of Systems   Review of Systems  Cardiovascular:  Positive for chest pain.    Physical Exam Updated Vital Signs BP 136/71   Pulse 63   Temp 98.3 F (36.8 C) (Oral)   Resp 16   LMP 04/10/2022   SpO2 97%  Physical Exam Vitals and nursing note reviewed.  Constitutional:      Appearance: She is well-developed.  HENT:     Head: Normocephalic and atraumatic.  Cardiovascular:      Rate and Rhythm: Normal rate and regular rhythm.  Pulmonary:     Effort: No respiratory distress.     Breath sounds: No stridor.  Abdominal:     General: There is no distension or abdominal bruit.     Palpations: Abdomen is soft. There is no hepatomegaly.  Musculoskeletal:        General: Normal range of motion.     Right shoulder: Tenderness (anterior left shoulder, left SCM and left posterior shoulder area, also worse with ROM) present.     Cervical back: Normal range of motion.  Skin:    General: Skin is warm and dry.  Neurological:     General: No focal deficit present.     Mental Status: She is alert.     ED Results / Procedures / Treatments   Labs (all labs ordered are listed, but only abnormal results are displayed) Labs Reviewed  BASIC METABOLIC PANEL - Abnormal; Notable for the following components:      Result Value   Glucose, Bld 109 (*)    All other components within normal limits  CBC - Abnormal; Notable for the following components:   Hemoglobin 11.4 (*)    HCT 35.4 (*)    All other components within normal limits  POC URINE PREG, ED  TROPONIN I (  HIGH SENSITIVITY)    EKG EKG Interpretation  Date/Time:  Monday May 09 2022 23:12:24 EDT Ventricular Rate:  69 PR Interval:  151 QRS Duration: 91 QT Interval:  407 QTC Calculation: 436 R Axis:   69 Text Interpretation: Sinus rhythm Low voltage, precordial leads Confirmed by Marily Memos [id number]) on 05/09/2022 11:56:14 PM  Radiology DG Chest Port 1 View  Result Date: 05/09/2022 CLINICAL DATA:  Left chest pain EXAM: PORTABLE CHEST 1 VIEW COMPARISON:  None Available. FINDINGS: The heart size and mediastinal contours are within normal limits. Both lungs are clear. The visualized skeletal structures are unremarkable. IMPRESSION: No active disease. Electronically Signed   By: Helyn Numbers M.D.   On: 05/09/2022 22:21    Procedures Procedures    Medications Ordered in ED Medications  ketorolac (TORADOL)  injection 15 mg (15 mg Intramuscular Given 05/09/22 2336)  methocarbamol (ROBAXIN) tablet 500 mg (500 mg Oral Given 05/09/22 2336)    ED Course/ Medical Decision Making/ A&P                           Medical Decision Making Amount and/or Complexity of Data Reviewed Labs: ordered.  Risk Prescription drug management.   Suspect main complaint is likely MSK. Labs already drawn, will await results and d/c on NSAIDs, muscle relaxer and supportive care. Low suspicion for PE, ACS or other acute emergent causes.   PCP working up other complaints currently.   Workup reassuring. Low suspicion for emergent conditions. Will fu w/ PCP.  Final Clinical Impression(s) / ED Diagnoses Final diagnoses:  Acute pain of left shoulder    Rx / DC Orders ED Discharge Orders          Ordered    cyclobenzaprine (FLEXERIL) 10 MG tablet  2 times daily PRN        05/10/22 0013              Ardean Melroy, Barbara Cower, MD 05/10/22 0041

## 2022-05-09 NOTE — ED Triage Notes (Signed)
Pt says around 1830 today she was at rest and started having pain in the left shoulder, left chest and some numbness in her left arm. Pain was coming and going and then became constant.

## 2022-05-10 MED ORDER — CYCLOBENZAPRINE HCL 10 MG PO TABS: 10.0000 mg | ORAL_TABLET | Freq: Two times a day (BID) | ORAL | 0 refills | Status: DC | PRN

## 2022-08-05 ENCOUNTER — Encounter (HOSPITAL_BASED_OUTPATIENT_CLINIC_OR_DEPARTMENT_OTHER): Payer: Self-pay

## 2022-08-05 DIAGNOSIS — R0683 Snoring: Secondary | ICD-10-CM

## 2022-08-07 ENCOUNTER — Emergency Department (HOSPITAL_COMMUNITY): Payer: Medicaid Other

## 2022-08-07 ENCOUNTER — Emergency Department (HOSPITAL_COMMUNITY)
Admission: EM | Admit: 2022-08-07 | Discharge: 2022-08-07 | Disposition: A | Discharge status: Home / Self Care | Payer: Medicaid Other | Attending: Emergency Medicine | Admitting: Emergency Medicine

## 2022-08-07 ENCOUNTER — Other Ambulatory Visit: Payer: Self-pay

## 2022-08-07 ENCOUNTER — Encounter (HOSPITAL_COMMUNITY): Payer: Self-pay

## 2022-08-07 DIAGNOSIS — E119 Type 2 diabetes mellitus without complications: Secondary | ICD-10-CM | POA: Insufficient documentation

## 2022-08-07 DIAGNOSIS — M5412 Radiculopathy, cervical region: Secondary | ICD-10-CM | POA: Diagnosis not present

## 2022-08-07 DIAGNOSIS — Z7984 Long term (current) use of oral hypoglycemic drugs: Secondary | ICD-10-CM | POA: Diagnosis not present

## 2022-08-07 DIAGNOSIS — M542 Cervicalgia: Secondary | ICD-10-CM | POA: Diagnosis present

## 2022-08-07 MED ORDER — ACETAMINOPHEN 325 MG PO TABS
650.0000 mg | ORAL_TABLET | Freq: Once | ORAL | Status: AC
  Administered 2022-08-07: 650 mg via ORAL
  Filled 2022-08-07: qty 2

## 2022-08-07 MED ORDER — IBUPROFEN 800 MG PO TABS
800.0000 mg | ORAL_TABLET | Freq: Once | ORAL | Status: AC
  Administered 2022-08-07: 800 mg via ORAL
  Filled 2022-08-07: qty 1

## 2022-08-07 NOTE — ED Triage Notes (Signed)
Pt reports pain and tingling to left mid back going up to shoulder and neck x "months", says last night pain, tingling, and numbness started going down left leg as well- pt says these symptoms are intermittent depending on body position.

## 2022-08-07 NOTE — Discharge Instructions (Addendum)
Please use Tylenol or ibuprofen for pain.  You may use 600 mg ibuprofen every 6 hours or 1000 mg of Tylenol every 6 hours.  You may choose to alternate between the 2.  This would be most effective.  Not to exceed 4 g of Tylenol within 24 hours.  Not to exceed 3200 mg ibuprofen 24 hours.  I recommend that you call the neurosurgeon to arrange follow-up, and possible MRI of the cervical spine.  Please return for further evaluation if your symptoms are worsening or you are not improving, or unable to get in with neurosurgery for further evaluation.

## 2022-08-07 NOTE — ED Provider Notes (Signed)
[facility] EMERGENCY DEPARTMENT Provider Note   CSN: [id number] Arrival date & time: 08/07/22  1907     History  Chief Complaint  Patient presents with   Tingling    And pain left back, shoulder, and down left leg x months    Candice Campos is a 31 y.o. female with past medical history significant for diabetes, at bedtime, depression, PCOS, obesity who presents with concern for pain, tingling going down the left mid back, neck, left shoulder intermittently for months.  Patient reports that she had some increase in the pain, tingling yesterday which interfered with her sleep.  She reports the "numbness is more like an intermittent tingling", denies any urinary, fecal incontinence, weakness, or back injury that she knows of.  HPI     Home Medications Prior to Admission medications   Medication Sig Start Date End Date Taking? Authorizing Provider  cyclobenzaprine (FLEXERIL) 10 MG tablet Take 1 tablet (10 mg total) by mouth 2 (two) times daily as needed for muscle spasms. 05/10/22   Mesner, Barbara Cower, MD  labetalol (NORMODYNE) 100 MG tablet Take 100 mg by mouth daily. 11/12/21   [provider]  metFORMIN (GLUCOPHAGE) 500 MG tablet Take 500 mg by mouth 2 (two) times daily. 09/29/21   [provider]      Allergies    Amoxicillin    Review of Systems   Review of Systems  All other systems reviewed and are negative.   Physical Exam Updated Vital Signs BP 136/83 (BP Location: Right Arm)   Pulse 78   Temp 98.6 F (37 C) (Oral)   Resp 16   Wt 119.7 kg   SpO2 100%   BMI 38.99 kg/m  Physical Exam Vitals and nursing note reviewed.  Constitutional:      General: She is not in acute distress.    Appearance: Normal appearance.  HENT:     Head: Normocephalic and atraumatic.  Eyes:     General:        Right eye: No discharge.        Left eye: No discharge.  Cardiovascular:     Rate and Rhythm: Normal rate and regular rhythm.  Pulmonary:     Effort:  Pulmonary effort is normal. No respiratory distress.  Musculoskeletal:        General: No deformity.     Comments: Some tenderness to palpation in the midline cervical spine, cervical paraspinous muscles  Skin:    General: Skin is warm and dry.  Neurological:     Mental Status: She is alert and oriented to person, place, and time.     Comments: Intact strength 5 out of 5 bilateral upper and lower extremities.  Normal coordination throughout.  Psychiatric:        Mood and Affect: Mood normal.        Behavior: Behavior normal.     ED Results / Procedures / Treatments   Labs (all labs ordered are listed, but only abnormal results are displayed) Labs Reviewed - No data to display  EKG None  Radiology CT Cervical Spine Wo Contrast  Result Date: 08/07/2022 CLINICAL DATA:  Cervical radiculopathy, no red flags EXAM: CT CERVICAL SPINE WITHOUT CONTRAST TECHNIQUE: Multidetector CT imaging of the cervical spine was performed without intravenous contrast. Multiplanar CT image reconstructions were also generated. RADIATION DOSE REDUCTION: This exam was performed according to the departmental dose-optimization program which includes automated exposure control, adjustment of the mA and/or kV according to patient size and/or use of  iterative reconstruction technique. COMPARISON:  None Available. FINDINGS: Alignment: Normal Skull base and vertebrae: No acute fracture. No primary bone lesion or focal pathologic process. Soft tissues and spinal canal: No prevertebral fluid or swelling. No visible canal hematoma. Disc levels: Uncovertebral spurring causes neural foraminal narrowing on the right at C3-4, C4-5. No left neural foraminal narrowing. No disc herniation. Upper chest: No acute findings Other: None IMPRESSION: No acute bony abnormality. Right neural foraminal narrowing at C3-4 and C4-5 due to uncovertebral spurring. Electronically Signed   By: Charlett Nose M.D.   On: 08/07/2022 22:57     Procedures Procedures    Medications Ordered in ED Medications  ibuprofen (ADVIL) tablet 800 mg (800 mg Oral Given 08/07/22 2054)  acetaminophen (TYLENOL) tablet 650 mg (650 mg Oral Given 08/07/22 2054)    ED Course/ Medical Decision Making/ A&P                           Medical Decision Making Amount and/or Complexity of Data Reviewed Radiology: ordered.  Risk OTC drugs. Prescription drug management.   This is an overall well-appearing 31 year old female who presents with concern for generalized tingling in the left shoulder, left mid back on and off for months, worse over the last day.  Patient reports seems triggered by certain movements.  No significant injury, previous neck or back surgery noted on patient history.  She does have a large body habitus, obesity.  My emergent differential diagnosis includes cervical radiculopathy, less likely to be subacute stroke or acute stroke given the duration of patient's symptoms as well as the intermittent chronicity.  Findings may represent patient's description of more musculoskeletal pain versus other.  Encouraged ibuprofen, Tylenol, rest, and neurosurgical follow-up. She does have a documented oxygen saturation of 86% on arrival, however she is not having any signs or distress, clear breath sounds bilaterally, I think that this is a spurious oxygen saturation and will request another document, on my evaluation patient with stable oxygen saturation of 100% on room air.  Repeat oxygen saturation shows this finding.  I independently interpreted imaging including CT cervical spine which shows right neuroforaminal narrowing secondary to uncovertebral spurring. I agree with the radiologist interpretation.  Given these findings, and patient's description of symptoms I am suspicious for radicular cervical symptoms at this time.  Encourage close neurosurgical follow-up.  Patient discharged in stable condition at this time, strict return precautions  given for worsening tingling, numbness, or new weakness.   Final Clinical Impression(s) / ED Diagnoses Final diagnoses:  Cervical radiculopathy    Rx / DC Orders ED Discharge Orders     None         [place] 08/07/22 2309    Bethann Berkshire, MD 08/08/22 1248

## 2022-08-08 ENCOUNTER — Emergency Department (HOSPITAL_COMMUNITY): Payer: Medicaid Other

## 2022-08-08 ENCOUNTER — Other Ambulatory Visit: Payer: Self-pay

## 2022-08-08 ENCOUNTER — Emergency Department (HOSPITAL_COMMUNITY)
Admission: EM | Admit: 2022-08-08 | Discharge: 2022-08-08 | Disposition: A | Discharge status: Home / Self Care | Payer: Medicaid Other | Attending: Emergency Medicine | Admitting: Emergency Medicine

## 2022-08-08 ENCOUNTER — Encounter (HOSPITAL_COMMUNITY): Payer: Self-pay

## 2022-08-08 DIAGNOSIS — M542 Cervicalgia: Secondary | ICD-10-CM | POA: Diagnosis present

## 2022-08-08 DIAGNOSIS — Z7984 Long term (current) use of oral hypoglycemic drugs: Secondary | ICD-10-CM | POA: Diagnosis not present

## 2022-08-08 DIAGNOSIS — M5412 Radiculopathy, cervical region: Secondary | ICD-10-CM | POA: Diagnosis not present

## 2022-08-08 LAB — BASIC METABOLIC PANEL
Anion gap: 8 (ref 5–15)
BUN: 13 mg/dL (ref 6–20)
CO2: 24 mmol/L (ref 22–32)
Calcium: 9.3 mg/dL (ref 8.9–10.3)
Chloride: 107 mmol/L (ref 98–111)
Creatinine, Ser: 0.68 mg/dL (ref 0.44–1.00)
GFR, Estimated: >60 mL/min (ref >60)
Glucose, Bld: 87 mg/dL (ref 70–99)
Potassium: 3.9 mmol/L (ref 3.5–5.1)
Sodium: 139 mmol/L (ref 135–145)

## 2022-08-08 LAB — CBC
HCT: 38.1 % (ref 36.0–46.0)
Hemoglobin: 12.1 g/dL (ref 12.0–15.0)
MCH: 26.7 pg (ref 26.0–34.0)
MCHC: 31.8 g/dL (ref 30.0–36.0)
MCV: 83.9 fL (ref 80.0–100.0)
Platelets: 355 10*3/uL (ref 150–400)
RBC: 4.54 MIL/uL (ref 3.87–5.11)
RDW: 14.5 % (ref 11.5–15.5)
WBC: 9.1 10*3/uL (ref 4.0–10.5)
nRBC: 0 % (ref 0.0–0.2)

## 2022-08-08 LAB — CBG MONITORING, ED: Glucose-Capillary: 89 mg/dL (ref 70–99)

## 2022-08-08 LAB — VITAMIN B12: Vitamin B-12: 311 pg/mL (ref 180–914)

## 2022-08-08 NOTE — Discharge Instructions (Signed)
Please use Tylenol or ibuprofen for pain.  You may use 600 mg ibuprofen every 6 hours or 1000 mg of Tylenol every 6 hours.  You may choose to alternate between the 2.  This would be most effective.  Not to exceed 4 g of Tylenol within 24 hours.  Not to exceed 3200 mg ibuprofen 24 hours.  Please follow-up with neurosurgery to discuss your radicular pain, and further treatment.  Provided some cervical strain sprain rehab exercises that may help with your symptoms, the next step if you are continuing to have symptoms would likely be a course of steroids, but I recommend that you see the neurosurgeon for return to the emergency department last you are having severe worsening of weakness, numbness, or began to experience symptoms in your groin, difficulty controlling your bladder or defecation.

## 2022-08-08 NOTE — ED Provider Notes (Signed)
[facility]-EMERGENCY DEPT Provider Note   CSN: [id number] Arrival date & time: 08/08/22  0865     History  Chief Complaint  Patient presents with   shoulder numbness   Shoulder Pain    Candice Campos is a 31 y.o. 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  Patient was seen evaluated by myself for any pain, received a CT of the cervical spine showing some neuroforaminal narrowing secondary to uncovertebral spurring, and advised to follow-up with neurosurgery.  Discussed return to the ED if patient was having worsening symptoms, weakness, patient reports that she woke up this morning feeling more weak than yesterday.  No new injury.   Shoulder Pain Associated symptoms: neck pain        Home Medications Prior to Admission medications   Medication Sig Start Date End Date Taking? Authorizing Provider  cyclobenzaprine (FLEXERIL) 10 MG tablet Take 1 tablet (10 mg total) by mouth 2 (two) times daily as needed for muscle spasms. 05/10/22   Mesner, Barbara Cower, MD  labetalol (NORMODYNE) 100 MG tablet Take 100 mg by mouth daily. 11/12/21   [provider]  metFORMIN (GLUCOPHAGE) 500 MG tablet Take 500 mg by mouth 2 (two) times daily. 09/29/21   [provider]      Allergies    Amoxicillin    Review of Systems   Review of Systems  Musculoskeletal:  Positive for neck pain.  All other systems reviewed and are negative.   Physical Exam Updated Vital Signs BP (!) 146/80 (BP Location: Left Arm)   Pulse 95   Temp 98.5 F (36.9 C) (Oral)   Resp 18   Ht 5\' 9"  (1.753 m)   Wt 119.7 kg   LMP 07/12/2022 (Approximate)   SpO2 98%   BMI 38.99 kg/m  Physical Exam Vitals and nursing note reviewed.  Constitutional:      General: She is not in  acute distress.    Appearance: Normal appearance.  HENT:     Head: Normocephalic and atraumatic.  Eyes:     General:        Right eye: No discharge.        Left eye: No discharge.  Cardiovascular:     Rate and Rhythm: Normal rate and regular rhythm.     Heart sounds: No murmur heard.    No friction rub. No gallop.  Pulmonary:     Effort: Pulmonary effort is normal.     Breath sounds: Normal breath sounds.  Abdominal:     General: Bowel sounds are normal.     Palpations: Abdomen is soft.  Musculoskeletal:     Comments: I do not appreciate any strength deficits on patient's exam.  Intact strength 5/5 bilateral upper and lower extremities.  Patient reports perceived weakness on the left side especially when she has been sitting for a long time but I do not note this strength deficit.  She has tenderness palpation in the cervical spine, cervical paraspinous muscles specially on the left.  No obvious step-off or deformity noted.  Skin:    General: Skin is warm and dry.     Capillary Refill: Capillary refill takes less than 2 seconds.  Neurological:     Mental Status: She is alert and oriented to person, place, and time.     Comments: Patient  with reported sensory deficit, tingling around the left shoulder blade, left shoulder.  Patient reports that tingling is intermittent, somewhat position dependent.  Psychiatric:        Mood and Affect: Mood normal.        Behavior: Behavior normal.     ED Results / Procedures / Treatments   Labs (all labs ordered are listed, but only abnormal results are displayed) Labs Reviewed  CBC  BASIC METABOLIC PANEL  VITAMIN B12  CBG MONITORING, ED    EKG None  Radiology MR Cervical Spine Wo Contrast  Result Date: 08/08/2022 CLINICAL DATA:  Myelopathy, acute, cervical spine EXAM: MRI CERVICAL SPINE WITHOUT CONTRAST TECHNIQUE: Multiplanar, multisequence MR imaging of the cervical spine was performed. No intravenous contrast was administered.  COMPARISON:  None Available. FINDINGS: Alignment: Preserved. Vertebrae: Vertebral body heights are maintained. No marrow edema. No suspicious osseous lesion. Cord: No abnormal signal. Posterior Fossa, vertebral arteries, paraspinal tissues: Unremarkable. Disc levels: C2-C3:  No canal or foraminal stenosis. C3-C4: Right foraminal disc osteophyte complex with right uncovertebral hypertrophy. No canal or left foraminal stenosis. Moderate to marked right foraminal stenosis. C4-C5: Right foraminal disc osteophyte complex with right uncovertebral hypertrophy. No canal or left foraminal stenosis. Moderate right foraminal stenosis. C5-C6:  No canal or foraminal stenosis. C6-C7:  No canal or foraminal stenosis. C7-T1:  No canal or foraminal stenosis. Imaged in the sagittal plane only, there are central disc protrusions at T1-T2 greater than T2-T3 without apparent stenosis. IMPRESSION: Degenerative changes as detailed above. No significant canal stenosis. Right foraminal stenosis at C3-C4 greater than C4-C5. Electronically Signed   By: Guadlupe Spanish M.D.   On: 08/08/2022 13:59   CT Cervical Spine Wo Contrast  Result Date: 08/07/2022 CLINICAL DATA:  Cervical radiculopathy, no red flags EXAM: CT CERVICAL SPINE WITHOUT CONTRAST TECHNIQUE: Multidetector CT imaging of the cervical spine was performed without intravenous contrast. Multiplanar CT image reconstructions were also generated. RADIATION DOSE REDUCTION: This exam was performed according to the departmental dose-optimization program which includes automated exposure control, adjustment of the mA and/or kV according to patient size and/or use of iterative reconstruction technique. COMPARISON:  None Available. FINDINGS: Alignment: Normal Skull base and vertebrae: No acute fracture. No primary bone lesion or focal pathologic process. Soft tissues and spinal canal: No prevertebral fluid or swelling. No visible canal hematoma. Disc levels: Uncovertebral spurring causes  neural foraminal narrowing on the right at C3-4, C4-5. No left neural foraminal narrowing. No disc herniation. Upper chest: No acute findings Other: None IMPRESSION: No acute bony abnormality. Right neural foraminal narrowing at C3-4 and C4-5 due to uncovertebral spurring. Electronically Signed   By: Charlett Nose M.D.   On: 08/07/2022 22:57   CT MAXILLOFACIAL WO CONTRAST  Result Date: 08/07/2022 CLINICAL DATA:  Cervical radiculopathy, no red flags EXAM: CT CERVICAL SPINE WITHOUT CONTRAST TECHNIQUE: Multidetector CT imaging of the cervical spine was performed without intravenous contrast. Multiplanar CT image reconstructions were also generated. RADIATION DOSE REDUCTION: This exam was performed according to the departmental dose-optimization program which includes automated exposure control, adjustment of the mA and/or kV according to patient size and/or use of iterative reconstruction technique. COMPARISON:  None Available. FINDINGS: Alignment: Normal Skull base and vertebrae: No acute fracture. No primary bone lesion or focal pathologic process. Soft tissues and spinal canal: No prevertebral fluid or swelling. No visible canal hematoma. Disc levels: Uncovertebral spurring causes neural foraminal narrowing on the right at C3-4, C4-5. No left neural foraminal narrowing. No disc herniation. Upper chest: No  acute findings Other: None IMPRESSION: No acute bony abnormality. Right neural foraminal narrowing at C3-4 and C4-5 due to uncovertebral spurring. Electronically Signed   By: Charlett Nose M.D.   On: 08/07/2022 22:57    Procedures Procedures    Medications Ordered in ED Medications - No data to display  ED Course/ Medical Decision Making/ A&P                           Medical Decision Making Amount and/or Complexity of Data Reviewed Labs: ordered. Radiology: ordered.   Overall well-appearing 31 year old female who presents with ongoing Worsening neck pain, tingling, numbness, and report of  weakness.  I personally saw and evaluated this person in any pain emergency department last night, she had a work-up including CT cervical spine which showed some uncovertebral spurring and neuroforaminal narrowing but no evidence of central canal dysfunction.  Given that she is having worsening symptoms, concern for possible weakness today I think it be reasonable to proceed with an MRI of the cervical spine.  I independently interpreted lab work, imaging including MR of the cervical spine, as well as CBC, BMP, B12, CBG.  Lab work is unremarkable, MR shows no central canal narrowing, redemonstrates uncovertebral spurring and neuroforaminal narrowing.  I have low clinical concern for any pathology in the thoracic or lumbar spine given that patient's symptoms are largely localized to the left upper extremity, with occasional nonpersistent tingling of the left leg.  I do not know any true neurologic deficits/strength deficits on patient's exam other than her sensory tingling.  My suspicion remains high for cervical radiculopathy given the nature of her symptoms, I recommend close neurosurgical follow up, nsaids, tylenol, rehab exercises. Patient again declines any oral steroids at this time, wanting to try conservative measures first. Final Clinical Impression(s) / ED Diagnoses Final diagnoses:  Cervical radiculopathy    Rx / DC Orders ED Discharge Orders     None         [place] 08/08/22 1420    Wynetta Fines, MD 08/08/22 1643

## 2022-08-08 NOTE — ED Triage Notes (Signed)
Patient c/o left shoulder pain, upper left arm pain and numbness that radiates into the mid back. Patient was seen at [facility] yesterday for the same and states she was told to come to this ED for an MRI.

## 2022-10-03 ENCOUNTER — Encounter: Payer: Self-pay | Admitting: Internal Medicine

## 2022-10-12 ENCOUNTER — Ambulatory Visit: Payer: Medicaid Other | Attending: Neurosurgery

## 2022-10-12 ENCOUNTER — Other Ambulatory Visit: Payer: Self-pay

## 2022-10-12 DIAGNOSIS — G8929 Other chronic pain: Secondary | ICD-10-CM | POA: Diagnosis present

## 2022-10-12 DIAGNOSIS — M25512 Pain in left shoulder: Secondary | ICD-10-CM | POA: Diagnosis present

## 2022-10-12 DIAGNOSIS — M25511 Pain in right shoulder: Secondary | ICD-10-CM | POA: Diagnosis present

## 2022-10-12 DIAGNOSIS — M6281 Muscle weakness (generalized): Secondary | ICD-10-CM | POA: Diagnosis present

## 2022-10-12 DIAGNOSIS — M542 Cervicalgia: Secondary | ICD-10-CM | POA: Insufficient documentation

## 2022-10-12 NOTE — Therapy (Signed)
OUTPATIENT PHYSICAL THERAPY SHOULDER/ CERVICAL EVALUATION   Patient Name: Candice Campos MRN: [id number] DOB:1991/11/22, 31 y.o., female Today's Date: 10/12/2022   PT End of Session - 10/12/22 1027     Visit Number 1    Number of Visits 9    Date for PT Re-Evaluation 12/14/22    Authorization Type [employer]    Authorization Time Period Pending auth    PT Start Time 1000    PT Stop Time 1035    PT Time Calculation (min) 35 min    Activity Tolerance Patient tolerated treatment well    Behavior During Therapy WFL for tasks assessed/performed             Past Medical History:  Diagnosis Date   Anemia    Diabetes mellitus without complication (HCC)    GERD (gastroesophageal reflux disease)    Hidradenitis suppurativa    Hypertension    PCOS (polycystic ovarian syndrome)    Vitamin D deficiency    Past Surgical History:  Procedure Laterality Date   CHOLECYSTECTOMY     Patient Active Problem List   Diagnosis Date Noted   Screening for cervical cancer 12/17/2021   Benign essential hypertension 07/06/2021   Diabetes mellitus (HCC) 07/06/2021   Hidradenitis suppurativa 07/06/2021   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   PCOS (polycystic ovarian syndrome) 07/06/2021   Vitamin D deficiency 07/06/2021    PCP: Bridgette Habermann, FNP   REFERRING PROVIDER:    Lisbeth Renshaw, MD    REFERRING DIAG: [phone] (ICD-10-CM) - Pain in left shoulder   THERAPY DIAG:  Cervicalgia - Plan: PT plan of care cert/re-cert  Chronic left shoulder pain - Plan: PT plan of care cert/re-cert  Chronic right shoulder pain - Plan: PT plan of care cert/re-cert  Muscle weakness (generalized) - Plan: PT plan of care cert/re-cert  Rationale for Evaluation and Treatment: Rehabilitation  ONSET DATE: Several months ago  SUBJECTIVE:                                                                                                                                                                                       SUBJECTIVE STATEMENT: Pt reports primary c/o Rt>Lt upper trap and Lt shoulder pain with associated cervical pain and occasional N/T in her Lt UE to the elbow. Pt denies any MOI related to these problems, adding that this began several months ago. Pt rates current pain as 6/10. Worst pain is 10/10. Best pain is 6/10. Pt denies any dizziness, nausea/ vomiting, dysphagia, dysarthria, or unexplained weight change. Pt reports the pain wakes her from her sleep 3-4 times per night. Aggravating factors include cervical and arm movements, laying  on Rt side, prolonged sitting. Easing factors include ibuprofen, laying on Lt side and back.  PERTINENT HISTORY: HTN, DMII  PAIN:  Are you having pain? Yes: NPRS scale: 6/10 Pain location: neck and BIL upper trap/ shoulders Pain description: tight Aggravating factors: cervical and arm movements, laying on Rt side, prolonged sitting Relieving factors: ibuprofen, laying on Lt side and back  PRECAUTIONS: None  WEIGHT BEARING RESTRICTIONS: No  FALLS:  Has patient fallen in last 6 months? No  LIVING ENVIRONMENT: Lives with: lives with their family Lives in: House/apartment Stairs: Yes: External: 5 steps; on right going up, on left going up, and can reach both Has following equipment at home: None  OCCUPATION: Hotel manager  PLOF: Independent  PATIENT GOALS: Playing with children at work, ADLs  NEXT MD VISIT:   OBJECTIVE:   DIAGNOSTIC FINDINGS:  08/08/2022: MR Cervical Spine WO Contrast: IMPRESSION: Degenerative changes as detailed above. No significant canal stenosis. Right foraminal stenosis at C3-C4 greater than C4-C5.  08/07/2022: IMPRESSION: No acute bony abnormality.   Right neural foraminal narrowing at C3-4 and C4-5 due to uncovertebral spurring.    PATIENT SURVEYS:  Katina Dung 40/55  COGNITION: Overall cognitive status: Within functional limits for tasks assessed     SENSATION: Light touch:  Diminished dermatomal sensation on Rt C4, C7, T1 vs Lt  POSTURE: Forward head and shoulders  UPPER EXTREMITY ROM:   A/PROM Right eval Left eval  Shoulder flexion WNL WNL  Shoulder abduction WNL WNL  Shoulder internal rotation WNL WNL  Shoulder external rotation WNL WNL  (Blank rows = not tested)  UPPER EXTREMITY MMT:  MMT Right eval Left eval  Shoulder flexion 4+/5p! 4+/5  Shoulder abduction 4+/5p! 4+/5  Shoulder internal rotation 5/5 5/5  Shoulder external rotation 5/5 5/5  Middle trapezius 3+/5 3+/4  Lower trapezius 3+/5 3+/5  Latissimus dorsi 4/5 4/5  Elbow flexion 5/5 5/5  Elbow extension 5/5 5/5  Wrist flexion 5/5 5/5  Wrist extension 5/5 5/5  Grip strength (lbs)    (Blank rows = not tested)   CERVICAL ROM:   AROM A/PROM (deg) 10/12/2022  Flexion 45  Extension 50p!  Right lateral flexion 25p!  Left lateral flexion 40p!  Right rotation 45, Rt UT p!  Left rotation 45, Rt UT p!   (Blank rows = not tested)   SPECIAL TESTS: Spurling's: (+) on Lt for N/T to upper arm Cervical compression: (+) Cervical distraction: (+) ULNTT Ulnar: (+) on Lt  FUNCTIONAL TESTS: DNF endurance test: 22 seconds, terminated due to pain  JOINT MOBILITY TESTING:  Hypomobile and painful CPAs C4-T7  PALPATION:  TTP to BIL lower cervical paraspinals/ UT   TODAY'S TREATMENT:                                                                                                                                          [facility] Adult PT Treatment:  DATE: 10/12/2022 Therapeutic Exercise: Demonstrated and issued HEP with pt education Pt education regarding POC, prognosis, and QuickDASH Manual Therapy: N/A Neuromuscular re-ed: N/A Therapeutic Activity: N/A Modalities: N/A Self Care: N/A    PATIENT EDUCATION: Education details: Pt educated on prognosis, POC, HEP, and QuickDASH Person educated: Patient Education method: Consulting civil engineer,  Media planner, and Handouts Education comprehension: verbalized understanding and returned demonstration  HOME EXERCISE PROGRAM: Access Code: [email or web address] URL: [email or web address] Date: 10/12/2022 Prepared by: Vanessa Pe Ell  Exercises - Seated Cervical Retraction  - 1 x daily - 7 x weekly - 3 sets - 10 reps - 5 seconds hold - Standing Shoulder Row with Anchored Resistance  - 1 x daily - 7 x weekly - 3 sets - 10 reps - 3 seconds hold - Standing Ulnar Nerve Glide  - 1 x daily - 7 x weekly - 3 sets - 20 reps  ASSESSMENT:  CLINICAL IMPRESSION: Patient is a 31 y.o. F who was seen today for physical therapy evaluation and treatment for chronic cervical and BIL shoulder pain with associated occasional Lt UE N/T.  Upon assessment, her primary impairments include painful and limited global cervical AROM with most limitation in BIL rotation, extension, and Rt side bend, weak functional DNF strength, weak BIL global parascapular strength, hypomobile and painful CPAs C4-T7, and TTP to BIL lower cervical paraspinals and upper traps. Ruling up cervical radiculopathy with likely ulnar nerve distribution due to impaired C8-T1 dermatomes, positive ulnar nerve ULNTT, positive cervical compression and distraction, and reports of occasional N/T down Lt arm associated with neck pain. Pt will benefit from skilled PT to address her primary impairments and return to her prior level of function with less limitation.  OBJECTIVE IMPAIRMENTS: decreased endurance, decreased mobility, decreased ROM, decreased strength, hypomobility, increased edema, impaired flexibility, impaired sensation, improper body mechanics, postural dysfunction, and pain.   ACTIVITY LIMITATIONS: carrying, lifting, sitting, standing, squatting, sleeping, transfers, dressing, and reach over head  PARTICIPATION LIMITATIONS: cleaning, laundry, driving, shopping, community activity, occupation, and yard work  PERSONAL FACTORS: 1-2  comorbidities: DMII, HTN  are also affecting patient's functional outcome.   REHAB POTENTIAL: Good  CLINICAL DECISION MAKING: Stable/uncomplicated  EVALUATION COMPLEXITY: Low   GOALS: Goals reviewed with patient? Yes  SHORT TERM GOALS: Target date: 11/09/2022   Pt will report understanding and adherence to initial HEP in order to promote independence in the management of primary impairments. Baseline: HEP provided at eval Goal status: INITIAL   LONG TERM GOALS: Target date: 12/07/2022   Pt will achieve a QuickDASH score of 25/55 or less in order to demonstrate improved functional ability as it relates to the pt's primary impairments. Baseline: 40/55 Goal status: INITIAL  2.  Pt will achieve WNL global cervical AROM with 0-3/10 pain in order to move her head while driving with less limitation. Baseline: See AROM chart Goal status: INITIAL  3.  Pt will achieve a DNF endurance test of 40 seconds or longer in order to demonstrate improved functional cervical strength in the prophylaxis of future mechanical neck pain. Baseline: 22 seconds Goal status: INITIAL  4.  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 Baseline: Pt reports >8/10 pain with lifting any amount of weight Goal status: INITIAL   PLAN:  PT FREQUENCY: 1x/week  PT DURATION: 8 weeks  PLANNED INTERVENTIONS: Therapeutic exercises, Therapeutic activity, Neuromuscular re-education, Patient/Family education, Self Care, Joint mobilization, Joint manipulation, Vestibular training,  Orthotic/Fit training, Aquatic Therapy, Dry Needling, Electrical stimulation, Spinal manipulation, Spinal mobilization, Cryotherapy, Moist heat, Taping, Vasopneumatic device, Traction, Ultrasound, Biofeedback, Ionotophoresis 4mg /ml Dexamethasone, Manual therapy, and Re-evaluation  PLAN FOR NEXT SESSION: Progress early DNF and parascapular  strengthening, manual techniques to include OMT and TDN PRN for cervical mobility   [employer] Authorization   Choose one: Rehabilitative  Standardized Assessment or Functional Outcome Tool: See Pain Assessment and Quick DASH  Score or Percent Disability: 40/55  Body Parts Treated (Select each separately):  Cervicothoracic. Overall deficits/functional limitations for body part selected: moderate Shoulder. Overall deficits/functional limitations for body part selected: mild N/A. Overall deficits/functional limitations for body part selected: N/A   If treatment provided at initial evaluation, no treatment charged due to lack of authorization.    Vanessa Concord, PT, DPT 10/12/22 11:11 AM

## 2022-10-25 ENCOUNTER — Ambulatory Visit: Payer: Medicaid Other | Admitting: Physical Therapy

## 2022-10-28 ENCOUNTER — Ambulatory Visit: Payer: Medicaid Other | Attending: General Practice | Admitting: Pulmonary Disease

## 2022-10-28 DIAGNOSIS — R0683 Snoring: Secondary | ICD-10-CM | POA: Diagnosis present

## 2022-10-28 DIAGNOSIS — R400 Somnolence: Secondary | ICD-10-CM | POA: Insufficient documentation

## 2022-10-31 ENCOUNTER — Emergency Department (HOSPITAL_COMMUNITY): Payer: Medicaid Other

## 2022-10-31 ENCOUNTER — Encounter (HOSPITAL_COMMUNITY): Payer: Self-pay | Admitting: *Deleted

## 2022-10-31 ENCOUNTER — Emergency Department (HOSPITAL_COMMUNITY)
Admission: EM | Admit: 2022-10-31 | Discharge: 2022-10-31 | Discharge status: Against Medical Advice | Payer: Medicaid Other | Attending: Emergency Medicine | Admitting: Emergency Medicine

## 2022-10-31 ENCOUNTER — Other Ambulatory Visit: Payer: Self-pay

## 2022-10-31 DIAGNOSIS — Z5321 Procedure and treatment not carried out due to patient leaving prior to being seen by health care provider: Secondary | ICD-10-CM | POA: Diagnosis not present

## 2022-10-31 DIAGNOSIS — R079 Chest pain, unspecified: Secondary | ICD-10-CM | POA: Insufficient documentation

## 2022-10-31 DIAGNOSIS — R1013 Epigastric pain: Secondary | ICD-10-CM | POA: Diagnosis not present

## 2022-10-31 LAB — CBC
HCT: 33.5 % — ABNORMAL LOW (ref 36.0–46.0)
Hemoglobin: 11 g/dL — ABNORMAL LOW (ref 12.0–15.0)
MCH: 26.4 pg (ref 26.0–34.0)
MCHC: 32.8 g/dL (ref 30.0–36.0)
MCV: 80.3 fL (ref 80.0–100.0)
Platelets: 355 10*3/uL (ref 150–400)
RBC: 4.17 MIL/uL (ref 3.87–5.11)
RDW: 14.3 % (ref 11.5–15.5)
WBC: 7.2 10*3/uL (ref 4.0–10.5)
nRBC: 0 % (ref 0.0–0.2)

## 2022-10-31 LAB — BASIC METABOLIC PANEL
Anion gap: 7 (ref 5–15)
BUN: 12 mg/dL (ref 6–20)
CO2: 25 mmol/L (ref 22–32)
Calcium: 8.8 mg/dL — ABNORMAL LOW (ref 8.9–10.3)
Chloride: 108 mmol/L (ref 98–111)
Creatinine, Ser: 0.89 mg/dL (ref 0.44–1.00)
GFR, Estimated: >60 mL/min (ref >60)
Glucose, Bld: 136 mg/dL — ABNORMAL HIGH (ref 70–99)
Potassium: 3.6 mmol/L (ref 3.5–5.1)
Sodium: 140 mmol/L (ref 135–145)

## 2022-10-31 LAB — TROPONIN I (HIGH SENSITIVITY): Troponin I (High Sensitivity): <2 ng/L (ref <18)

## 2022-10-31 NOTE — ED Triage Notes (Signed)
Pt with mid CP and epigastric pain x 3 days, hurts to bend over, feels like need to belch but unable to.

## 2022-11-01 DIAGNOSIS — R0683 Snoring: Secondary | ICD-10-CM | POA: Diagnosis not present

## 2022-11-01 NOTE — Procedures (Signed)
Patient Name: Candice Campos, Candice Campos Date: 10/28/2022 Gender: Female D.O.B: 09/15/1991 Age (years): 31 Referring Provider: Bridgette Habermann NP Height (inches): 69 Interpreting Physician: Cyril Mourning MD, ABSM Weight (lbs): 281 RPSGT: Peak, Robert BMI: 41 MRN: [id number] Neck Size: 21.00 <br> <br> CLINICAL INFORMATION Sleep Study Type: NPSG    Indication for sleep study: snoring, daytime somnolence    Epworth Sleepiness Score: 10    SLEEP STUDY TECHNIQUE As per the AASM Manual for the Scoring of Sleep and Associated Events v2.3 (April 2016) with a hypopnea requiring 4% desaturations.  The channels recorded and monitored were frontal, central and occipital EEG, electrooculogram (EOG), submentalis EMG (chin), nasal and oral airflow, thoracic and abdominal wall motion, anterior tibialis EMG, snore microphone, electrocardiogram, and pulse oximetry.  MEDICATIONS Medications self-administered by patient taken the night of the study : N/A  SLEEP ARCHITECTURE The study was initiated at 8:37:35 PM and ended at 5:08:22 AM.  Sleep onset time was 124.1 minutes and the sleep efficiency was 65.0%. The total sleep time was 332.2 minutes.  Stage REM latency was 175.0 minutes.  The patient spent 4.71% of the night in stage N1 sleep, 62.62% in stage N2 sleep, 12.34% in stage N3 and 20.3% in REM.  Alpha intrusion was absent.  Supine sleep was 0.00%.  RESPIRATORY PARAMETERS The overall apnea/hypopnea index (AHI) was 1.8 per hour. There were 0 total apneas, including 0 obstructive, 0 central and 0 mixed apneas. There were 10 hypopneas and 4 RERAs.  The AHI during Stage REM sleep was 6.2 per hour.  AHI while supine was N/A per hour.  The mean oxygen saturation was 94.30%. The minimum SpO2 during sleep was 86.00%.  soft snoring was noted during this study.  CARDIAC DATA The 2 lead EKG demonstrated sinus rhythm. The mean heart rate was 65.65 beats per minute. Other EKG findings  include: None.   LEG MOVEMENT DATA The total PLMS were 0 with a resulting PLMS index of 0.00. Associated arousal with leg movement index was 0.0 .  IMPRESSIONS - No significant obstructive sleep apnea occurred during this study (AHI = 1.8/h). - Mild oxygen desaturation was noted during this study (Min O2 = 86.00%). - The patient snored with soft snoring volume. - No cardiac abnormalities were noted during this study. - Clinically significant periodic limb movements did not occur during sleep. No significant associated arousals.  DIAGNOSIS - No significant sleep disordered breathing but sub-optimal study in that no supine sleep was noted   RECOMMENDATIONS - If clinical concern for OSA, consider repeating as home sleep test with supine sleep - Avoid alcohol, sedatives and other CNS depressants that may worsen sleep apnea and disrupt normal sleep architecture. - Sleep hygiene should be reviewed to assess factors that may improve sleep quality. - Weight management and regular exercise should be initiated or continued if appropriate.   Cyril Mourning MD Board Certified in Sleep medicine

## 2022-11-05 ENCOUNTER — Encounter: Payer: Self-pay | Admitting: Physical Therapy

## 2022-11-05 ENCOUNTER — Ambulatory Visit: Payer: Medicaid Other | Attending: Neurosurgery | Admitting: Physical Therapy

## 2022-11-05 DIAGNOSIS — M542 Cervicalgia: Secondary | ICD-10-CM | POA: Diagnosis present

## 2022-11-05 DIAGNOSIS — G8929 Other chronic pain: Secondary | ICD-10-CM | POA: Insufficient documentation

## 2022-11-05 DIAGNOSIS — M25511 Pain in right shoulder: Secondary | ICD-10-CM | POA: Insufficient documentation

## 2022-11-05 DIAGNOSIS — M6281 Muscle weakness (generalized): Secondary | ICD-10-CM | POA: Diagnosis present

## 2022-11-05 DIAGNOSIS — M25512 Pain in left shoulder: Secondary | ICD-10-CM | POA: Insufficient documentation

## 2022-11-05 NOTE — Therapy (Signed)
OUTPATIENT PHYSICAL THERAPY TREATMENT NOTE   Patient Name: Candice Campos MRN: [id number] DOB:11-May-1991, 31 y.o., female Today's Date: 11/05/2022  PCP: Bridgette Habermann, FNP     REFERRING PROVIDER:    Lisbeth Renshaw, MD     PT End of Session - 11/05/22 1033     Visit Number 2    Number of Visits 9    Date for PT Re-Evaluation 12/14/22    Authorization Type [employer]    Authorization Time Period Approved 10 visits 10/12/22-12/11/22    PT Start Time 1030    PT Stop Time 1112    PT Time Calculation (min) 42 min    Activity Tolerance Patient tolerated treatment well    Behavior During Therapy [facility] for tasks assessed/performed             Past Medical History:  Diagnosis Date   Anemia    Diabetes mellitus without complication (HCC)    GERD (gastroesophageal reflux disease)    Hidradenitis suppurativa    Hypertension    PCOS (polycystic ovarian syndrome)    Vitamin D deficiency    Past Surgical History:  Procedure Laterality Date   CHOLECYSTECTOMY     Patient Active Problem List   Diagnosis Date Noted   Screening for cervical cancer 12/17/2021   Benign essential hypertension 07/06/2021   Diabetes mellitus (HCC) 07/06/2021   Hidradenitis suppurativa 07/06/2021   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   PCOS (polycystic ovarian syndrome) 07/06/2021   Vitamin D deficiency 07/06/2021    THERAPY DIAG:  Cervicalgia  Chronic left shoulder pain  Chronic right shoulder pain   Rationale for Evaluation and Treatment Rehabilitation  REFERRING DIAG: M25.512 (ICD-10-CM) - Pain in left shoulder    PERTINENT HISTORY: HTN, DMII   PRECAUTIONS/RESTRICTIONS:   none  SUBJECTIVE:  Pt reports intermittent HEP compliance.  She states the HEP has been helpful.  She reports moderate N/T in L UE.  PAIN:  Are you having pain? Yes: NPRS scale: 4/10 Pain location: neck and BIL upper trap/ shoulders Pain description: tight Aggravating factors: cervical and  arm movements, laying on Rt side, prolonged sitting Relieving factors: ibuprofen, laying on Lt side and back  OBJECTIVE: (objective measures completed at initial evaluation unless otherwise dated)  DIAGNOSTIC FINDINGS:  08/08/2022: MR Cervical Spine WO Contrast: IMPRESSION: Degenerative changes as detailed above. No significant canal stenosis. Right foraminal stenosis at C3-C4 greater than C4-C5.   08/07/2022: IMPRESSION: No acute bony abnormality.   Right neural foraminal narrowing at C3-4 and C4-5 due to uncovertebral spurring.       PATIENT SURVEYS:  Neldon Mc 40/55   COGNITION: Overall cognitive status: Within functional limits for tasks assessed                                  SENSATION: Light touch: Diminished dermatomal sensation on Rt C4, C7, T1 vs Lt   POSTURE: Forward head and shoulders   UPPER EXTREMITY ROM:    A/PROM Right eval Left eval  Shoulder flexion WNL WNL  Shoulder abduction WNL WNL  Shoulder internal rotation WNL WNL  Shoulder external rotation WNL WNL  (Blank rows = not tested)   UPPER EXTREMITY MMT:   MMT Right eval Left eval  Shoulder flexion 4+/5p! 4+/5  Shoulder abduction 4+/5p! 4+/5  Shoulder internal rotation 5/5 5/5  Shoulder external rotation 5/5 5/5  Middle trapezius 3+/5 3+/4  Lower trapezius 3+/5 3+/5  Latissimus dorsi 4/5  4/5  Elbow flexion 5/5 5/5  Elbow extension 5/5 5/5  Wrist flexion 5/5 5/5  Wrist extension 5/5 5/5  Grip strength (lbs)      (Blank rows = not tested)     CERVICAL ROM:    AROM A/PROM (deg) 10/12/2022  Flexion 45  Extension 50p!  Right lateral flexion 25p!  Left lateral flexion 40p!  Right rotation 45, Rt UT p!  Left rotation 45, Rt UT p!   (Blank rows = not tested)     SPECIAL TESTS: Spurling's: (+) on Lt for N/T to upper arm Cervical compression: (+) Cervical distraction: (+) ULNTT Ulnar: (+) on Lt   FUNCTIONAL TESTS: DNF endurance test: 22 seconds, terminated due to pain   JOINT  MOBILITY TESTING:  Hypomobile and painful CPAs C4-T7   PALPATION:  TTP to BIL lower cervical paraspinals/ UT              HOME EXERCISE PROGRAM: Access Code: [email or web address] URL: [email or web address] Date: 10/12/2022 Prepared by: Carmelina Dane   Exercises - Seated Cervical Retraction  - 1 x daily - 7 x weekly - 3 sets - 10 reps - 5 seconds hold - Standing Shoulder Row with Anchored Resistance  - 1 x daily - 7 x weekly - 3 sets - 10 reps - 3 seconds hold - Standing Ulnar Nerve Glide  - 1 x daily - 7 x weekly - 3 sets - 20 reps  TREATMENT 11/05/2022:  Therapeutic Exercise: - UBE 2.5'/2.5' fwd and backward for warm up while taking subjective - Foam roller routine for thoracic mobility - including protraction/retraction (unilateral and bilateral), cc/cw circles, horizontal abduction, shoulder flexion/ext alternating, shoulder abduction - horizontal abd with GTB - 3x10 - seated ER bil with GTB - 3x10 - neck 3 way fwd and lat - 2x10 ea  Manual Therapy: - sub occipital release - chin tuck with OP - manual cervical traction    CLINICAL IMPRESSION: Asja tolerated session well with no adverse reaction.  She responds well to both therex and manual therapy.  She fatigues with periscapular strengthening rapidly.  We attempted resisted cervical ext which resulted in a brief but intense muscle cramp which dissipated quickly.  Pt reports complete resolution of L are paraesthesia and a reduction in pain to 2/10.   OBJECTIVE IMPAIRMENTS: decreased endurance, decreased mobility, decreased ROM, decreased strength, hypomobility, increased edema, impaired flexibility, impaired sensation, improper body mechanics, postural dysfunction, and pain.    ACTIVITY LIMITATIONS: carrying, lifting, sitting, standing, squatting, sleeping, transfers, dressing, and reach over head   PARTICIPATION LIMITATIONS: cleaning, laundry, driving, shopping, community activity, occupation, and yard work    PERSONAL FACTORS: 1-2 comorbidities: DMII, HTN  are also affecting patient's functional outcome.    REHAB POTENTIAL: Good   CLINICAL DECISION MAKING: Stable/uncomplicated   EVALUATION COMPLEXITY: Low     GOALS: Goals reviewed with patient? Yes   SHORT TERM GOALS: Target date: 11/09/2022    Pt will report understanding and adherence to initial HEP in order to promote independence in the management of primary impairments. Baseline: HEP provided at eval Goal status: INITIAL     LONG TERM GOALS: Target date: 12/07/2022    Pt will achieve a QuickDASH score of 25/55 or less in order to demonstrate improved functional ability as it relates to the pt's primary impairments. Baseline: 40/55 Goal status: INITIAL   2.  Pt will achieve WNL global cervical AROM with 0-3/10 pain in order to move her head while driving with less limitation. Baseline: See  AROM chart Goal status: INITIAL   3.  Pt will achieve a DNF endurance test of 40 seconds or longer in order to demonstrate improved functional cervical strength in the prophylaxis of future mechanical neck pain. Baseline: 22 seconds Goal status: INITIAL   4.  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 Baseline: Pt reports >8/10 pain with lifting any amount of weight Goal status: INITIAL     PLAN:   PT FREQUENCY: 1x/week   PT DURATION: 8 weeks   PLANNED INTERVENTIONS: Therapeutic exercises, Therapeutic activity, Neuromuscular re-education, Patient/Family education, Self Care, Joint mobilization, Joint manipulation, Vestibular training, Orthotic/Fit training, Aquatic Therapy, Dry Needling, Electrical stimulation, Spinal manipulation, Spinal mobilization, Cryotherapy, Moist heat, Taping, Vasopneumatic device, Traction, Ultrasound, Biofeedback, Ionotophoresis 4mg /ml Dexamethasone, Manual therapy, and Re-evaluation   PLAN FOR NEXT  SESSION: Progress early DNF and parascapular strengthening, manual techniques to include OMT and TDN PRN for cervical mobility   Tatum Massman PT 11/05/2022, 11:17 AM

## 2022-11-09 ENCOUNTER — Ambulatory Visit (INDEPENDENT_AMBULATORY_CARE_PROVIDER_SITE_OTHER): Payer: Medicaid Other | Admitting: Internal Medicine

## 2022-11-09 ENCOUNTER — Encounter: Payer: Self-pay | Admitting: *Deleted

## 2022-11-09 ENCOUNTER — Encounter: Payer: Self-pay | Admitting: Internal Medicine

## 2022-11-09 VITALS — BP 116/74 | HR 92 | Temp 98.3°F | Ht 69.0 in | Wt 275.7 lb

## 2022-11-09 DIAGNOSIS — R1013 Epigastric pain: Secondary | ICD-10-CM

## 2022-11-09 DIAGNOSIS — K5904 Chronic idiopathic constipation: Secondary | ICD-10-CM | POA: Diagnosis not present

## 2022-11-09 DIAGNOSIS — Z791 Long term (current) use of non-steroidal anti-inflammatories (NSAID): Secondary | ICD-10-CM

## 2022-11-09 DIAGNOSIS — K219 Gastro-esophageal reflux disease without esophagitis: Secondary | ICD-10-CM | POA: Diagnosis not present

## 2022-11-09 DIAGNOSIS — R1319 Other dysphagia: Secondary | ICD-10-CM

## 2022-11-09 DIAGNOSIS — G8929 Other chronic pain: Secondary | ICD-10-CM

## 2022-11-09 MED ORDER — PANTOPRAZOLE SODIUM 40 MG PO TBEC: 40.0000 mg | DELAYED_RELEASE_TABLET | Freq: Two times a day (BID) | ORAL | 5 refills | Status: DC

## 2022-11-09 NOTE — Progress Notes (Signed)
Primary Care Physician:  Bridgette Habermann, FNP Primary Gastroenterologist:  Dr. Marletta Lor  Chief Complaint  Patient presents with   Gastroesophageal Reflux    New patient. Referred for GERD. Having some chest pain and epi gastric pain, throat burning, feels like something is stuck in throat all the time, bloating. Tried omeprazole 40mg  and otc acid reducers, tums. No relief. Also concerns of constipation. Tried miralax. Goes 5 -6 days without BM. Feels like she does not empty out when having BM. Reports heart palpitations for past year.     HPI:   Candice Campos is a 31 y.o. female who presents to the clinic today by referral by her PCP Dr. 26 for evaluation.   Patient notes chronic acid reflux. Symptoms moderate to severe, occur daily. Notes associated dysphagia, abdominal bloating as well as chest/epigastric pain. Status post cholecystectomy 2017.  Takes BC powders every day. Usually once a day but has taken up to 3-4x daily before.   No previous endoscopy.  Also notes chronic constipation.  Will have a bowel movement once every 5 to 7 days.  Has tried MiraLAX without much relief.  Notes associated left upper quadrant abdominal pain.  Bloating as well.  No melena hematochezia.  No family history of colorectal malignancy.  Constipation is mild to moderate, occurs daily.  Past Medical History:  Diagnosis Date   Anemia    Diabetes mellitus without complication (HCC)    GERD (gastroesophageal reflux disease)    Hidradenitis suppurativa    Hypertension    PCOS (polycystic ovarian syndrome)    Vitamin D deficiency     Past Surgical History:  Procedure Laterality Date   CHOLECYSTECTOMY      Current Outpatient Medications  Medication Sig Dispense Refill   ezetimibe (ZETIA) 10 MG tablet Take 10 mg by mouth daily.     labetalol (NORMODYNE) 100 MG tablet Take 100 mg by mouth daily.     metFORMIN (GLUCOPHAGE) 500 MG tablet Take 500 mg by mouth 2 (two) times daily.      omeprazole (PRILOSEC) 40 MG capsule Take 40 mg by mouth daily.     OVER THE COUNTER MEDICATION Apple cider supplement 450mg  daily     rosuvastatin (CRESTOR) 40 MG tablet Take 40 mg by mouth daily.     topiramate (TOPAMAX) 25 MG tablet Take 25 mg by mouth daily.     Vitamin D, Ergocalciferol, (DRISDOL) 1.25 MG (50000 UNIT) CAPS capsule Take 50,000 Units by mouth every 7 (seven) days.     No current facility-administered medications for this visit.    Allergies as of 11/09/2022 - Review Complete 11/09/2022  Allergen Reaction Noted   Amoxicillin  01/11/2018   Corticosteroids  11/09/2022    Family History  Problem Relation Age of Onset   Hypertension Mother    Diabetes Mother     Social History   Socioeconomic History   Marital status: Single    Spouse name: Not on file   Number of children: Not on file   Years of education: Not on file   Highest education level: Not on file  Occupational History   Not on file  Tobacco Use   Smoking status: Former    Packs/day: 1.00    Types: Cigarettes    Passive exposure: Past   Smokeless tobacco: Never  Vaping Use   Vaping Use: Never used  Substance and Sexual Activity   Alcohol use: No   Drug use: No   Sexual activity: Not Currently  Birth control/protection: None  Other Topics Concern   Not on file  Social History Narrative   Not on file   Social Determinants of Health   Financial Resource Strain: Not on file  Food Insecurity: Not on file  Transportation Needs: Not on file  Physical Activity: Not on file  Stress: Not on file  Social Connections: Not on file  Intimate Partner Violence: Not on file    Subjective: Review of Systems  Constitutional:  Negative for chills and fever.  HENT:  Negative for congestion and hearing loss.   Eyes:  Negative for blurred vision and double vision.  Respiratory:  Negative for cough and shortness of breath.   Cardiovascular:  Positive for chest pain. Negative for palpitations.   Gastrointestinal:  Positive for constipation and heartburn. Negative for abdominal pain, blood in stool, diarrhea, melena and vomiting.       Dysphagia  Genitourinary:  Negative for dysuria and urgency.  Musculoskeletal:  Negative for joint pain and myalgias.  Skin:  Negative for itching and rash.  Neurological:  Negative for dizziness and headaches.  Psychiatric/Behavioral:  Negative for depression. The patient is not nervous/anxious.        Objective: BP 116/74 (BP Location: Left Arm, Patient Position: Sitting, Cuff Size: Large)   Pulse 92   Temp 98.3 F (36.8 C) (Oral)   Ht 5\' 9"  (1.753 m)   Wt 275 lb 11.2 oz (125.1 kg)   LMP 10/13/2022   Breastfeeding No   BMI 40.71 kg/m  Physical Exam Constitutional:      Appearance: Normal appearance.  HENT:     Head: Normocephalic and atraumatic.  Eyes:     Extraocular Movements: Extraocular movements intact.     Conjunctiva/sclera: Conjunctivae normal.  Cardiovascular:     Rate and Rhythm: Normal rate and regular rhythm.  Pulmonary:     Effort: Pulmonary effort is normal.     Breath sounds: Normal breath sounds.  Abdominal:     General: Bowel sounds are normal.     Palpations: Abdomen is soft.  Musculoskeletal:        General: No swelling. Normal range of motion.     Cervical back: Normal range of motion and neck supple.  Skin:    General: Skin is warm and dry.     Coloration: Skin is not jaundiced.  Neurological:     General: No focal deficit present.     Mental Status: She is alert and oriented to person, place, and time.  Psychiatric:        Mood and Affect: Mood normal.        Behavior: Behavior normal.      Assessment: *GERD-uncontrolled *Dysphagia *Epigastric/chest pain *Chronic idiopathic constipation *Chronic NSAID use  Plan: Will schedule for EGD with possible dilation to evaluate for peptic ulcer disease, esophagitis, gastritis, H. Pylori, duodenitis, or other. Will also evaluate for esophageal  stricture, Schatzki's ring, esophageal web or other.   The risks including infection, bleed, or perforation as well as benefits, limitations, alternatives and imponderables have been reviewed with the patient. Potential for esophageal dilation, biopsy, etc. have also been reviewed.  Questions have been answered. All parties agreeable.  Switch Omeprazole to Pantoprazole twice daily.   Limit NSAID use  For constipation will give samples of Linzess 145 mcg daily. Counseled on initial washout period. Counseled on room to increase or decrease dose depending on how she responds. Call with update in 1 week.   Thank you Dr. Mikey Bussing for the kind  referral.  11/09/2022 9:48 AM   Disclaimer: This note was dictated with voice recognition software. Similar sounding words can inadvertently be transcribed and may not be corrected upon review.

## 2022-11-09 NOTE — Patient Instructions (Signed)
For your chronic reflux, I am going to switch your omeprazole to pantoprazole 40 mg twice daily.  This medication works best if you take it 30 minutes before you eat or drink anything.  In regards to nightly dose you can take 30 minutes before dinner or at bedtime.  Try to limit your BC powder use.  Tylenol okay to take.  We will schedule you for upper endoscopy to further evaluate.  I may elect to stretch your esophagus depending on findings.  For your constipation, I will give you samples of Linzess 145 mcg daily.  We have room to increase or decrease dose depending on how you respond.  Let us know in 1 to 2 weeks how you are doing and I will send in formal prescription.  Linzess works best when taken once a day every day, on an empty stomach, at least 30 minutes before your first meal of the day.  When Linzess is taken daily as directed:  *Constipation relief is typically felt in about a week *IBS-C patients may begin to experience relief from belly pain and overall abdominal symptoms (pain, discomfort, and bloating) in about 1 week,   with symptoms typically improving over 12 weeks.  Diarrhea may occur in the first 2 weeks -keep taking it.  The diarrhea should go away and you should start having normal, complete, full bowel movements. It may be helpful to start treatment when you can be near the comfort of your own bathroom, such as a weekend.   It was very nice meeting you today.  Dr. Marletta Lor

## 2022-11-09 NOTE — H&P (View-Only) (Signed)
Primary Care Physician:  Bridgette Habermann, FNP Primary Gastroenterologist:  Dr. Marletta Lor  Chief Complaint  Patient presents with   Gastroesophageal Reflux    New patient. Referred for GERD. Having some chest pain and epi gastric pain, throat burning, feels like something is stuck in throat all the time, bloating. Tried omeprazole 40mg  and otc acid reducers, tums. No relief. Also concerns of constipation. Tried miralax. Goes 5 -6 days without BM. Feels like she does not empty out when having BM. Reports heart palpitations for past year.     HPI:   Candice Campos is a 31 y.o. female who presents to the clinic today by referral by her PCP Dr. 26 for evaluation.   Patient notes chronic acid reflux. Symptoms moderate to severe, occur daily. Notes associated dysphagia, abdominal bloating as well as chest/epigastric pain. Status post cholecystectomy 2017.  Takes BC powders every day. Usually once a day but has taken up to 3-4x daily before.   No previous endoscopy.  Also notes chronic constipation.  Will have a bowel movement once every 5 to 7 days.  Has tried MiraLAX without much relief.  Notes associated left upper quadrant abdominal pain.  Bloating as well.  No melena hematochezia.  No family history of colorectal malignancy.  Constipation is mild to moderate, occurs daily.  Past Medical History:  Diagnosis Date   Anemia    Diabetes mellitus without complication (HCC)    GERD (gastroesophageal reflux disease)    Hidradenitis suppurativa    Hypertension    PCOS (polycystic ovarian syndrome)    Vitamin D deficiency     Past Surgical History:  Procedure Laterality Date   CHOLECYSTECTOMY      Current Outpatient Medications  Medication Sig Dispense Refill   ezetimibe (ZETIA) 10 MG tablet Take 10 mg by mouth daily.     labetalol (NORMODYNE) 100 MG tablet Take 100 mg by mouth daily.     metFORMIN (GLUCOPHAGE) 500 MG tablet Take 500 mg by mouth 2 (two) times daily.      omeprazole (PRILOSEC) 40 MG capsule Take 40 mg by mouth daily.     OVER THE COUNTER MEDICATION Apple cider supplement 450mg  daily     rosuvastatin (CRESTOR) 40 MG tablet Take 40 mg by mouth daily.     topiramate (TOPAMAX) 25 MG tablet Take 25 mg by mouth daily.     Vitamin D, Ergocalciferol, (DRISDOL) 1.25 MG (50000 UNIT) CAPS capsule Take 50,000 Units by mouth every 7 (seven) days.     No current facility-administered medications for this visit.    Allergies as of 11/09/2022 - Review Complete 11/09/2022  Allergen Reaction Noted   Amoxicillin  01/11/2018   Corticosteroids  11/09/2022    Family History  Problem Relation Age of Onset   Hypertension Mother    Diabetes Mother     Social History   Socioeconomic History   Marital status: Single    Spouse name: Not on file   Number of children: Not on file   Years of education: Not on file   Highest education level: Not on file  Occupational History   Not on file  Tobacco Use   Smoking status: Former    Packs/day: 1.00    Types: Cigarettes    Passive exposure: Past   Smokeless tobacco: Never  Vaping Use   Vaping Use: Never used  Substance and Sexual Activity   Alcohol use: No   Drug use: No   Sexual activity: Not Currently  Birth control/protection: None  Other Topics Concern   Not on file  Social History Narrative   Not on file   Social Determinants of Health   Financial Resource Strain: Not on file  Food Insecurity: Not on file  Transportation Needs: Not on file  Physical Activity: Not on file  Stress: Not on file  Social Connections: Not on file  Intimate Partner Violence: Not on file    Subjective: Review of Systems  Constitutional:  Negative for chills and fever.  HENT:  Negative for congestion and hearing loss.   Eyes:  Negative for blurred vision and double vision.  Respiratory:  Negative for cough and shortness of breath.   Cardiovascular:  Positive for chest pain. Negative for palpitations.   Gastrointestinal:  Positive for constipation and heartburn. Negative for abdominal pain, blood in stool, diarrhea, melena and vomiting.       Dysphagia  Genitourinary:  Negative for dysuria and urgency.  Musculoskeletal:  Negative for joint pain and myalgias.  Skin:  Negative for itching and rash.  Neurological:  Negative for dizziness and headaches.  Psychiatric/Behavioral:  Negative for depression. The patient is not nervous/anxious.        Objective: BP 116/74 (BP Location: Left Arm, Patient Position: Sitting, Cuff Size: Large)   Pulse 92   Temp 98.3 F (36.8 C) (Oral)   Ht 5' 9" (1.753 m)   Wt 275 lb 11.2 oz (125.1 kg)   LMP 10/13/2022   Breastfeeding No   BMI 40.71 kg/m  Physical Exam Constitutional:      Appearance: Normal appearance.  HENT:     Head: Normocephalic and atraumatic.  Eyes:     Extraocular Movements: Extraocular movements intact.     Conjunctiva/sclera: Conjunctivae normal.  Cardiovascular:     Rate and Rhythm: Normal rate and regular rhythm.  Pulmonary:     Effort: Pulmonary effort is normal.     Breath sounds: Normal breath sounds.  Abdominal:     General: Bowel sounds are normal.     Palpations: Abdomen is soft.  Musculoskeletal:        General: No swelling. Normal range of motion.     Cervical back: Normal range of motion and neck supple.  Skin:    General: Skin is warm and dry.     Coloration: Skin is not jaundiced.  Neurological:     General: No focal deficit present.     Mental Status: She is alert and oriented to person, place, and time.  Psychiatric:        Mood and Affect: Mood normal.        Behavior: Behavior normal.      Assessment: *GERD-uncontrolled *Dysphagia *Epigastric/chest pain *Chronic idiopathic constipation *Chronic NSAID use  Plan: Will schedule for EGD with possible dilation to evaluate for peptic ulcer disease, esophagitis, gastritis, H. Pylori, duodenitis, or other. Will also evaluate for esophageal  stricture, Schatzki's ring, esophageal web or other.   The risks including infection, bleed, or perforation as well as benefits, limitations, alternatives and imponderables have been reviewed with the patient. Potential for esophageal dilation, biopsy, etc. have also been reviewed.  Questions have been answered. All parties agreeable.  Switch Omeprazole to Pantoprazole twice daily.   Limit NSAID use  For constipation will give samples of Linzess 145 mcg daily. Counseled on initial washout period. Counseled on room to increase or decrease dose depending on how she responds. Call with update in 1 week.   Thank you Dr. Dhivianathan for the kind   referral.  11/09/2022 9:48 AM   Disclaimer: This note was dictated with voice recognition software. Similar sounding words can inadvertently be transcribed and may not be corrected upon review.  

## 2022-11-11 ENCOUNTER — Telehealth: Payer: Self-pay | Admitting: *Deleted

## 2022-11-11 NOTE — Telephone Encounter (Signed)
Per [facility] fax, no PA required for EGD

## 2022-11-12 ENCOUNTER — Ambulatory Visit: Payer: Medicaid Other | Admitting: Physical Therapy

## 2022-11-16 ENCOUNTER — Encounter: Payer: Self-pay | Admitting: Physical Therapy

## 2022-11-16 ENCOUNTER — Ambulatory Visit: Payer: Medicaid Other | Admitting: Physical Therapy

## 2022-11-16 DIAGNOSIS — G8929 Other chronic pain: Secondary | ICD-10-CM

## 2022-11-16 DIAGNOSIS — M542 Cervicalgia: Secondary | ICD-10-CM | POA: Diagnosis not present

## 2022-11-16 DIAGNOSIS — M6281 Muscle weakness (generalized): Secondary | ICD-10-CM

## 2022-11-16 NOTE — Patient Instructions (Signed)
Candice Campos  11/16/2022     @PREFPERIOPPHARMACY @   Your procedure is scheduled on  11/24/2022.   Report to 11/26/2022 at  0830  A.M.   Call this number if you have problems the morning of surgery:  [phone]  If you experience any cold or flu symptoms such as cough, fever, chills, shortness of breath, etc. between now and your scheduled surgery, please notify [phone] at the above number.   Remember:  Follow the diet instructions given to you by the office.      DO NOT take any medications for diabetes the morning of your procedure.     Take these medicines the morning of surgery with A SIP OF WATER                  labetolol, pantoprazole, topamax.     Do not wear jewelry, make-up or nail polish.  Do not wear lotions, powders, or perfumes, or deodorant.  Do not shave 48 hours prior to surgery.  Men may shave face and neck.  Do not bring valuables to the hospital.  [facility] is not responsible for any belongings or valuables.  Contacts, dentures or bridgework may not be worn into surgery.  Leave your suitcase in the car.  After surgery it may be brought to your room.  For patients admitted to the hospital, discharge time will be determined by your treatment team.  Patients discharged the day of surgery will not be allowed to drive home and must have someone with them for 24 hours.    Special instructions:   DO NOT smoke tobacco or vape for 24 hours before your procedure.  Please read over the following fact sheets that you were given. Anesthesia Post-op Instructions and Care and Recovery After Surgery      Upper Endoscopy, Adult, Care After After the procedure, it is common to have a sore throat. It is also common to have: Mild stomach pain or discomfort. Bloating. Nausea. Follow these instructions at home: The instructions below may help you care for yourself at home. Your health care provider may give you more instructions. If you have questions,  ask your health care provider. If you were given a sedative during the procedure, it can affect you for several hours. Do not drive or operate machinery until your health care provider says that it is safe. If you will be going home right after the procedure, plan to have a responsible adult: Take you home from the hospital or clinic. You will not be allowed to drive. Care for you for the time you are told. Follow instructions from your health care provider about what you may eat and drink. Return to your normal activities as told by your health care provider. Ask your health care provider what activities are safe for you. Take over-the-counter and prescription medicines only as told by your health care provider. Contact a health care provider if you: Have a sore throat that lasts longer than one day. Have trouble swallowing. Have a fever. Get help right away if you: Vomit blood or your vomit looks like coffee grounds. Have bloody, black, or tarry stools. Have a very bad sore throat or you cannot swallow. Have difficulty breathing or very bad pain in your chest or abdomen. These symptoms may be an emergency. Get help right away. Call 911. Do not wait to see if the symptoms will go away. Do not drive yourself to the hospital. Summary After  the procedure, it is common to have a sore throat, mild stomach discomfort, bloating, and nausea. If you were given a sedative during the procedure, it can affect you for several hours. Do not drive until your health care provider says that it is safe. Follow instructions from your health care provider about what you may eat and drink. Return to your normal activities as told by your health care provider. This information is not intended to replace advice given to you by your health care provider. Make sure you discuss any questions you have with your health care provider. Document Revised: 02/23/2022 Document Reviewed: 02/23/2022 Elsevier Patient  Education  [place] After The following information offers guidance on how to care for yourself after your procedure. Your health care provider may also give you more specific instructions. If you have problems or questions, contact your health care provider. What can I expect after the procedure? After the procedure, it is common to have: Tiredness. Little or no memory about what happened during or after the procedure. Impaired judgment when it comes to making decisions. Nausea or vomiting. Some trouble with balance. Follow these instructions at home: For the time period you were told by your health care provider:  Rest. Do not participate in activities where you could fall or become injured. Do not drive or use machinery. Do not drink alcohol. Do not take sleeping pills or medicines that cause drowsiness. Do not make important decisions or sign legal documents. Do not take care of children on your own. Medicines Take over-the-counter and prescription medicines only as told by your health care provider. If you were prescribed antibiotics, take them as told by your health care provider. Do not stop using the antibiotic even if you start to feel better. Eating and drinking Follow instructions from your health care provider about what you may eat and drink. Drink enough fluid to keep your urine pale yellow. If you vomit: Drink clear fluids slowly and in small amounts as you are able. Clear fluids include water, ice chips, low-calorie sports drinks, and fruit juice that has water added to it (diluted fruit juice). Eat light and bland foods in small amounts as you are able. These foods include bananas, applesauce, rice, lean meats, toast, and crackers. General instructions  Have a responsible adult stay with you for the time you are told. It is important to have someone help care for you until you are awake and alert. If you have sleep apnea,  surgery and some medicines can increase your risk for breathing problems. Follow instructions from your health care provider about wearing your sleep device: When you are sleeping. This includes during daytime naps. While taking prescription pain medicines, sleeping medicines, or medicines that make you drowsy. Do not use any products that contain nicotine or tobacco. These products include cigarettes, chewing tobacco, and vaping devices, such as e-cigarettes. If you need help quitting, ask your health care provider. Contact a health care provider if: You feel nauseous or vomit every time you eat or drink. You feel light-headed. You are still sleepy or having trouble with balance after 24 hours. You get a rash. You have a fever. You have redness or swelling around the IV site. Get help right away if: You have trouble breathing. You have new confusion after you get home. These symptoms may be an emergency. Get help right away. Call 911. Do not wait to see if the symptoms will go away. Do not drive  yourself to the hospital. This information is not intended to replace advice given to you by your health care provider. Make sure you discuss any questions you have with your health care provider. Document Revised: 04/11/2022 Document Reviewed: 04/11/2022 Elsevier Patient Education  [place].

## 2022-11-16 NOTE — Therapy (Signed)
OUTPATIENT PHYSICAL THERAPY TREATMENT NOTE   Patient Name: Candice Campos MRN: [id number] DOB:[birth year], 31 y.o., female Today's Date: 11/17/2022  PCP: Ray Church, FNP     REFERRING PROVIDER:    Consuella Lose, MD     PT End of Session - 11/16/22 1821     Visit Number 3    Number of Visits 9    Date for PT Re-Evaluation 12/14/22    Authorization Type [employer]    Authorization Time Period Approved 10 visits 10/12/22-12/11/22    PT Start Time 1821    PT Stop Time 1901    PT Time Calculation (min) 40 min    Activity Tolerance Patient tolerated treatment well    Behavior During Therapy [facility] for tasks assessed/performed             Past Medical History:  Diagnosis Date   Anemia    Diabetes mellitus without complication ([place])    GERD (gastroesophageal reflux disease)    Hidradenitis suppurativa    Hypertension    PCOS (polycystic ovarian syndrome)    Vitamin D deficiency    Past Surgical History:  Procedure Laterality Date   CHOLECYSTECTOMY     Patient Active Problem List   Diagnosis Date Noted   Screening for cervical cancer 12/17/2021   Benign essential hypertension 07/06/2021   Diabetes mellitus ([place]) 07/06/2021   Hidradenitis suppurativa 07/06/2021   Moderate major depression ([place]) 07/06/2021   PCOS (polycystic ovarian syndrome) 07/06/2021   Vitamin D deficiency 07/06/2021    THERAPY DIAG:  Cervicalgia  Chronic left shoulder pain  Muscle weakness (generalized)  Chronic right shoulder pain   Rationale for Evaluation and Treatment Rehabilitation  REFERRING DIAG: M25.512 (ICD-10-CM) - Pain in left shoulder    PERTINENT HISTORY: HTN, DMII   PRECAUTIONS/RESTRICTIONS:   none  SUBJECTIVE:  Pt reports a positive response to last treatment.  PAIN:  Are you having pain? Yes: NPRS scale: 4/10 Pain location: neck and BIL upper trap/ shoulders Pain description: tight Aggravating factors: cervical and arm movements, laying on Rt  side, prolonged sitting Relieving factors: ibuprofen, laying on Lt side and back  OBJECTIVE: (objective measures completed at initial evaluation unless otherwise dated)  DIAGNOSTIC FINDINGS:  08/08/2022: MR Cervical Spine WO Contrast: IMPRESSION: Degenerative changes as detailed above. No significant canal stenosis. Right foraminal stenosis at C3-C4 greater than C4-C5.   08/07/2022: IMPRESSION: No acute bony abnormality.   Right neural foraminal narrowing at C3-4 and C4-5 due to uncovertebral spurring.       PATIENT SURVEYS:  Katina Dung 40/55   COGNITION: Overall cognitive status: Within functional limits for tasks assessed                                  SENSATION: Light touch: Diminished dermatomal sensation on Rt C4, C7, T1 vs Lt   POSTURE: Forward head and shoulders   UPPER EXTREMITY ROM:    A/PROM Right eval Left eval  Shoulder flexion WNL WNL  Shoulder abduction WNL WNL  Shoulder internal rotation WNL WNL  Shoulder external rotation WNL WNL  (Blank rows = not tested)   UPPER EXTREMITY MMT:   MMT Right eval Left eval  Shoulder flexion 4+/5p! 4+/5  Shoulder abduction 4+/5p! 4+/5  Shoulder internal rotation 5/5 5/5  Shoulder external rotation 5/5 5/5  Middle trapezius 3+/5 3+/4  Lower trapezius 3+/5 3+/5  Latissimus dorsi 4/5 4/5  Elbow flexion 5/5 5/5  Elbow extension  5/5 5/5  Wrist flexion 5/5 5/5  Wrist extension 5/5 5/5  Grip strength (lbs)      (Blank rows = not tested)     CERVICAL ROM:    AROM A/PROM (deg) 10/12/2022  Flexion 45  Extension 50p!  Right lateral flexion 25p!  Left lateral flexion 40p!  Right rotation 45, Rt UT p!  Left rotation 45, Rt UT p!   (Blank rows = not tested)     SPECIAL TESTS: Spurling's: (+) on Lt for N/T to upper arm Cervical compression: (+) Cervical distraction: (+) ULNTT Ulnar: (+) on Lt   FUNCTIONAL TESTS: DNF endurance test: 22 seconds, terminated due to pain   JOINT MOBILITY TESTING:   Hypomobile and painful CPAs C4-T7   PALPATION:  TTP to BIL lower cervical paraspinals/ UT              HOME EXERCISE PROGRAM: Access Code: [email or web address] URL: [email or web address] Date: 11/16/2022 Prepared by: Shearon Balo  Exercises - Seated Cervical Retraction  - 1 x daily - 7 x weekly - 3 sets - 10 reps - 5 seconds hold - Standing Shoulder Row with Anchored Resistance  - 1 x daily - 7 x weekly - 3 sets - 10 reps - 3 seconds hold - Standing Ulnar Nerve Glide  - 1 x daily - 7 x weekly - 3 sets - 20 reps - Bridge  - 1 x daily - 7 x weekly - 3 sets - 10 reps - 5-10'' hold - Hooklying Isometric Clamshell  - 1 x daily - 7 x weekly - 3 sets - 10 reps - Supine Hip Adduction Isometric with Ball  - 1 x daily - 7 x weekly - 2 sets - 10 reps - 10'' hold  TREATMENT 11/16/2022:  Therapeutic Exercise: - UBE 2.5'/2.5' fwd and backward for warm up while taking subjective - Foam roller routine for thoracic mobility - including protraction/retraction (unilateral and bilateral), cc/cw circles, horizontal abduction, shoulder flexion/ext alternating, shoulder abduction - horizontal abd with GTB - 3x10 - seated ER bil with GTB - 3x10 - LTR - 3x10 - supine bridge - 3x10 - alternating clam - GTB - 2x10 ea   Manual Therapy: - sub occipital release - manual cervical traction    CLINICAL IMPRESSION: Niomie tolerated session well with no adverse reaction.  Pt with more complaint of low back pain today than neck pain.  We integrated some hip and core exercises to good effect.  HEP updated   OBJECTIVE IMPAIRMENTS: decreased endurance, decreased mobility, decreased ROM, decreased strength, hypomobility, increased edema, impaired flexibility, impaired sensation, improper body mechanics, postural dysfunction, and pain.    ACTIVITY LIMITATIONS: carrying, lifting, sitting, standing, squatting, sleeping, transfers, dressing, and reach over head   PARTICIPATION LIMITATIONS: cleaning,  laundry, driving, shopping, community activity, occupation, and yard work   PERSONAL FACTORS: 1-2 comorbidities: DMII, HTN  are also affecting patient's functional outcome.    REHAB POTENTIAL: Good   CLINICAL DECISION MAKING: Stable/uncomplicated   EVALUATION COMPLEXITY: Low     GOALS: Goals reviewed with patient? Yes   SHORT TERM GOALS: Target date: 11/09/2022    Pt will report understanding and adherence to initial HEP in order to promote independence in the management of primary impairments. Baseline: HEP provided at eval Goal status: partially met 12/13     LONG TERM GOALS: Target date: 12/07/2022    Pt will achieve a QuickDASH score of 25/55 or less in order to demonstrate improved functional ability as it relates to the  pt's primary impairments. Baseline: 40/55 Goal status: INITIAL   2.  Pt will achieve WNL global cervical AROM with 0-3/10 pain in order to move her head while driving with less limitation. Baseline: See AROM chart Goal status: INITIAL   3.  Pt will achieve a DNF endurance test of 40 seconds or longer in order to demonstrate improved functional cervical strength in the prophylaxis of future mechanical neck pain. Baseline: 22 seconds Goal status: INITIAL   4.  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 Baseline: Pt reports >8/10 pain with lifting any amount of weight Goal status: INITIAL     PLAN:   PT FREQUENCY: 1x/week   PT DURATION: 8 weeks   PLANNED INTERVENTIONS: Therapeutic exercises, Therapeutic activity, Neuromuscular re-education, Patient/Family education, Self Care, Joint mobilization, Joint manipulation, Vestibular training, Orthotic/Fit training, Aquatic Therapy, Dry Needling, Electrical stimulation, Spinal manipulation, Spinal mobilization, Cryotherapy, Moist heat, Taping, Vasopneumatic device, Traction, Ultrasound, Biofeedback,  Ionotophoresis 42m/ml Dexamethasone, Manual therapy, and Re-evaluation   PLAN FOR NEXT SESSION: Progress early DNF and parascapular strengthening, manual techniques to include OMT and TDN PRN for cervical mobility   KKevan NyReinhartsen PT 11/17/2022, 7:08 AM

## 2022-11-23 ENCOUNTER — Encounter (HOSPITAL_COMMUNITY)
Admission: RE | Admit: 2022-11-23 | Discharge: 2022-11-23 | Disposition: A | Discharge status: Home / Self Care | Payer: Medicaid Other | Source: Ambulatory Visit | Attending: Internal Medicine | Admitting: Internal Medicine

## 2022-11-23 ENCOUNTER — Encounter (HOSPITAL_COMMUNITY): Payer: Self-pay

## 2022-11-23 VITALS — BP 116/75 | HR 79 | Temp 98.3°F | Resp 18 | Ht 69.0 in | Wt 275.7 lb

## 2022-11-23 DIAGNOSIS — Z01812 Encounter for preprocedural laboratory examination: Secondary | ICD-10-CM | POA: Diagnosis present

## 2022-11-23 DIAGNOSIS — Z01818 Encounter for other preprocedural examination: Secondary | ICD-10-CM

## 2022-11-23 HISTORY — DX: Anxiety disorder, unspecified: F41.9

## 2022-11-23 LAB — POCT PREGNANCY, URINE: Preg Test, Ur: NEGATIVE

## 2022-11-24 ENCOUNTER — Encounter (HOSPITAL_COMMUNITY): Payer: Self-pay

## 2022-11-24 ENCOUNTER — Ambulatory Visit (HOSPITAL_COMMUNITY)
Admission: RE | Admit: 2022-11-24 | Discharge: 2022-11-24 | Disposition: A | Discharge status: Home / Self Care | Payer: Medicaid Other | Attending: Internal Medicine | Admitting: Internal Medicine

## 2022-11-24 ENCOUNTER — Ambulatory Visit (HOSPITAL_COMMUNITY): Payer: Medicaid Other | Admitting: Anesthesiology

## 2022-11-24 ENCOUNTER — Other Ambulatory Visit: Payer: Self-pay

## 2022-11-24 ENCOUNTER — Ambulatory Visit (HOSPITAL_BASED_OUTPATIENT_CLINIC_OR_DEPARTMENT_OTHER): Payer: Medicaid Other | Admitting: Anesthesiology

## 2022-11-24 ENCOUNTER — Encounter (HOSPITAL_COMMUNITY)
Admission: RE | Disposition: A | Discharge status: Home / Self Care | Payer: Self-pay | Source: Home / Self Care | Attending: Internal Medicine

## 2022-11-24 DIAGNOSIS — E119 Type 2 diabetes mellitus without complications: Secondary | ICD-10-CM | POA: Diagnosis not present

## 2022-11-24 DIAGNOSIS — K5904 Chronic idiopathic constipation: Secondary | ICD-10-CM | POA: Insufficient documentation

## 2022-11-24 DIAGNOSIS — Z7984 Long term (current) use of oral hypoglycemic drugs: Secondary | ICD-10-CM | POA: Diagnosis not present

## 2022-11-24 DIAGNOSIS — K319 Disease of stomach and duodenum, unspecified: Secondary | ICD-10-CM | POA: Insufficient documentation

## 2022-11-24 DIAGNOSIS — I10 Essential (primary) hypertension: Secondary | ICD-10-CM | POA: Diagnosis not present

## 2022-11-24 DIAGNOSIS — K2970 Gastritis, unspecified, without bleeding: Secondary | ICD-10-CM

## 2022-11-24 DIAGNOSIS — F418 Other specified anxiety disorders: Secondary | ICD-10-CM | POA: Diagnosis not present

## 2022-11-24 DIAGNOSIS — D649 Anemia, unspecified: Secondary | ICD-10-CM | POA: Insufficient documentation

## 2022-11-24 DIAGNOSIS — D759 Disease of blood and blood-forming organs, unspecified: Secondary | ICD-10-CM | POA: Insufficient documentation

## 2022-11-24 DIAGNOSIS — R1013 Epigastric pain: Secondary | ICD-10-CM | POA: Diagnosis present

## 2022-11-24 DIAGNOSIS — Z9049 Acquired absence of other specified parts of digestive tract: Secondary | ICD-10-CM | POA: Insufficient documentation

## 2022-11-24 DIAGNOSIS — Z87891 Personal history of nicotine dependence: Secondary | ICD-10-CM | POA: Diagnosis not present

## 2022-11-24 DIAGNOSIS — K2900 Acute gastritis without bleeding: Secondary | ICD-10-CM | POA: Insufficient documentation

## 2022-11-24 DIAGNOSIS — K219 Gastro-esophageal reflux disease without esophagitis: Secondary | ICD-10-CM | POA: Insufficient documentation

## 2022-11-24 DIAGNOSIS — R1310 Dysphagia, unspecified: Secondary | ICD-10-CM | POA: Diagnosis not present

## 2022-11-24 HISTORY — PX: ESOPHAGOGASTRODUODENOSCOPY (EGD) WITH PROPOFOL: SHX5813

## 2022-11-24 HISTORY — PX: BIOPSY: SHX5522

## 2022-11-24 LAB — GLUCOSE, CAPILLARY: Glucose-Capillary: 98 mg/dL (ref 70–99)

## 2022-11-24 SURGERY — ESOPHAGOGASTRODUODENOSCOPY (EGD) WITH PROPOFOL
Anesthesia: General

## 2022-11-24 MED ORDER — PROPOFOL 10 MG/ML IV BOLUS
INTRAVENOUS | Status: DC | PRN
  Administered 2022-11-24 (×2): 100 mg via INTRAVENOUS

## 2022-11-24 MED ORDER — LIDOCAINE HCL (CARDIAC) PF 100 MG/5ML IV SOSY
PREFILLED_SYRINGE | INTRAVENOUS | Status: DC | PRN
  Administered 2022-11-24: 50 mg via INTRAVENOUS

## 2022-11-24 MED ORDER — LACTATED RINGERS IV SOLN: INTRAVENOUS | Status: DC

## 2022-11-24 NOTE — Anesthesia Preprocedure Evaluation (Addendum)
Anesthesia Evaluation  Patient identified by MRN, date of birth, ID band Patient awake    Reviewed: Allergy & Precautions, H&P , NPO status , Patient's Chart, lab work & pertinent test results, reviewed documented beta blocker date and time   Airway Mallampati: I  TM Distance: >3 FB Neck ROM: Full    Dental  (+) Dental Advisory Given, Teeth Intact   Pulmonary neg sleep apnea (snoring), former smoker IMPRESSIONS - No significant obstructive sleep apnea occurred during this study (AHI = 1.8/h). - Mild oxygen desaturation was noted during this study (Min O2 = 86.00%). - The patient snored with soft snoring volume. - No cardiac abnormalities were noted during this study. - Clinically significant periodic limb movements did not occur during sleep. No significant associated arousals.   DIAGNOSIS - No significant sleep disordered breathing but sub-optimal study in that no supine sleep was noted     RECOMMENDATIONS - If clinical concern for OSA, consider repeating as home sleep test with supine sleep - Avoid alcohol, sedatives and other CNS depressants that may worsen sleep apnea and disrupt normal sleep architecture. - Sleep hygiene should be reviewed to assess factors that may improve sleep quality. - Weight management and regular exercise should be initiated or continued if appropriate.     Cyril Mourning MD Board Certified in Sleep medicine       Last Resulted: 10/28/22 20:00       Pulmonary exam normal breath sounds clear to auscultation       Cardiovascular Exercise Tolerance: Good hypertension, Pt. on medications and Pt. on home beta blockers Normal cardiovascular exam Rhythm:Regular Rate:Normal     Neuro/Psych  PSYCHIATRIC DISORDERS Anxiety Depression    negative neurological ROS     GI/Hepatic Neg liver ROS,GERD  Medicated and Poorly Controlled,,  Endo/Other  diabetes, Well Controlled, Type 2, Oral Hypoglycemic  Agents    Renal/GU negative Renal ROS  negative genitourinary   Musculoskeletal negative musculoskeletal ROS (+)    Abdominal   Peds negative pediatric ROS (+)  Hematology  (+) Blood dyscrasia, anemia   Anesthesia Other Findings   Reproductive/Obstetrics negative OB ROS                             Anesthesia Physical Anesthesia Plan  ASA: 3  Anesthesia Plan: General   Post-op Pain Management: Minimal or no pain anticipated   Induction: Intravenous  PONV Risk Score and Plan: Propofol infusion  Airway Management Planned: Nasal Cannula and Natural Airway  Additional Equipment:   Intra-op Plan:   Post-operative Plan:   Informed Consent: I have reviewed the patients History and Physical, chart, labs and discussed the procedure including the risks, benefits and alternatives for the proposed anesthesia with the patient or authorized representative who has indicated his/her understanding and acceptance.     Dental advisory given  Plan Discussed with: CRNA and Surgeon  Anesthesia Plan Comments:         Anesthesia Quick Evaluation

## 2022-11-24 NOTE — Discharge Instructions (Addendum)
EGD Discharge instructions Please read the instructions outlined below and refer to this sheet in the next few weeks. These discharge instructions provide you with general information on caring for yourself after you leave the hospital. Your doctor may also give you specific instructions. While your treatment has been planned according to the most current medical practices available, unavoidable complications occasionally occur. If you have any problems or questions after discharge, please call your doctor. ACTIVITY You may resume your regular activity but move at a slower pace for the next 24 hours.  Take frequent rest periods for the next 24 hours.  Walking will help expel (get rid of) the air and reduce the bloated feeling in your abdomen.  No driving for 24 hours (because of the anesthesia (medicine) used during the test).  You may shower.  Do not sign any important legal documents or operate any machinery for 24 hours (because of the anesthesia used during the test).  NUTRITION Drink plenty of fluids.  You may resume your normal diet.  Begin with a light meal and progress to your normal diet.  Avoid alcoholic beverages for 24 hours or as instructed by your caregiver.  MEDICATIONS You may resume your normal medications unless your caregiver tells you otherwise.  WHAT YOU CAN EXPECT TODAY You may experience abdominal discomfort such as a feeling of fullness or "gas" pains.  FOLLOW-UP Your doctor will discuss the results of your test with you.  SEEK IMMEDIATE MEDICAL ATTENTION IF ANY OF THE FOLLOWING OCCUR: Excessive nausea (feeling sick to your stomach) and/or vomiting.  Severe abdominal pain and distention (swelling).  Trouble swallowing.  Temperature over 101 F (37.8 C).  Rectal bleeding or vomiting of blood.    Your EGD revealed mild amount inflammation in your stomach.  I took biopsies of this to rule out infection with a bacteria called H. pylori.  Await pathology results, my  office will contact you.  Your esophagus and small bowel appeared normal.  Continue on pantoprazole twice daily.  Limit NSAID use as best as you can.  Follow-up with GI in 2 to 3 months.  I hope you have a great rest of your week!  Candice Campos. Marletta Lor, D.O. Gastroenterology and Hepatology [facility]

## 2022-11-24 NOTE — Op Note (Signed)
[facility] Patient Name: Candice Campos Procedure Date: 11/24/2022 9:27 AM MRN: [id number] Date of Birth: 06-Jan-1991 Attending MD: Elon Alas. Abbey Chatters , [place], [id number] CSN: [id number] Age: 31 Admit Type: Outpatient Procedure:                Upper GI endoscopy Indications:              Epigastric abdominal pain, Dysphagia, Heartburn Providers:                Elon Alas. Abbey Chatters, DO, Charlsie Quest. Theda Sers RN, RN,                            Caprice Kluver, Ladoris Gene Technician, Technician Referring MD:              Medicines:                See the Anesthesia note for documentation of the                            administered medications Complications:            No immediate complications. Estimated Blood Loss:     Estimated blood loss was minimal. Procedure:                Pre-Anesthesia Assessment:                           - The anesthesia plan was to use monitored                            anesthesia care (MAC).                           After obtaining informed consent, the endoscope was                            passed under direct vision. Throughout the                            procedure, the patient's blood pressure, pulse, and                            oxygen saturations were monitored continuously. The                            GIF-H190 ([id number]) scope was introduced through the                            mouth, and advanced to the second part of duodenum.                            The upper GI endoscopy was accomplished without                            difficulty. The patient tolerated the procedure  well. Scope In: 9:38:38 AM Scope Out: 9:42:06 AM Total Procedure Duration: 0 hours 3 minutes 28 seconds  Findings:      The Z-line was regular and was found 39 cm from the incisors.      There is no endoscopic evidence of stenosis or stricture in the entire       esophagus.      Patchy moderate inflammation characterized by congestion  (edema) and       erythema was found in the gastric body and in the gastric antrum.       Biopsies were taken with a cold forceps for Helicobacter pylori testing.      The duodenal bulb, first portion of the duodenum and second portion of       the duodenum were normal. Impression:               - Z-line regular, 39 cm from the incisors.                           - Gastritis. Biopsied.                           - Normal duodenal bulb, first portion of the                            duodenum and second portion of the duodenum. Moderate Sedation:      Per Anesthesia Care Recommendation:           - Patient has a contact number available for                            emergencies. The signs and symptoms of potential                            delayed complications were discussed with the                            patient. Return to normal activities tomorrow.                            Written discharge instructions were provided to the                            patient.                           - Resume previous diet.                           - Continue present medications.                           - Await pathology results.                           - Use Protonix (pantoprazole) 40 mg PO BID.                           - No  ibuprofen, naproxen, or other non-steroidal                            anti-inflammatory drugs.                           - Return to GI clinic in 3 months. Procedure Code(s):        --- Professional ---                           [phone], Esophagogastroduodenoscopy, flexible,                            transoral; with biopsy, single or multiple Diagnosis Code(s):        --- Professional ---                           K29.70, Gastritis, unspecified, without bleeding                           R10.13, Epigastric pain                           R13.10, Dysphagia, unspecified                           R12, Heartburn CPT copyright 2022 American [employer] Association. All  rights reserved. The codes documented in this report are preliminary and upon coder review may  be revised to meet current compliance requirements. Elon Alas. Abbey Chatters, DO Palmer Abbey Chatters, DO 11/24/2022 9:47:10 AM This report has been signed electronically. Number of Addenda: 0

## 2022-11-24 NOTE — Interval H&P Note (Signed)
History and Physical Interval Note:  11/24/2022 9:12 AM  Candice Campos  has presented today for surgery, with the diagnosis of EPIGASTRIC PAIN, REFLUX, Dysphagia.  The various methods of treatment have been discussed with the patient and family. After consideration of risks, benefits and other options for treatment, the patient has consented to  Procedure(s) with comments: ESOPHAGOGASTRODUODENOSCOPY (EGD) WITH PROPOFOL (N/A) - 10:30am, asa 3 as a surgical intervention.  The patient's history has been reviewed, patient examined, no change in status, stable for surgery.  I have reviewed the patient's chart and labs.  Questions were answered to the patient's satisfaction.     Lanelle Bal

## 2022-11-24 NOTE — Anesthesia Postprocedure Evaluation (Signed)
Anesthesia Post Note  Patient: Melisia Leming  Procedure(s) Performed: ESOPHAGOGASTRODUODENOSCOPY (EGD) WITH PROPOFOL BIOPSY  Patient location during evaluation: Phase II Anesthesia Type: General Level of consciousness: awake and alert and oriented Pain management: pain level controlled Vital Signs Assessment: post-procedure vital signs reviewed and stable Respiratory status: spontaneous breathing, nonlabored ventilation and respiratory function stable Cardiovascular status: blood pressure returned to baseline and stable Postop Assessment: no apparent nausea or vomiting Anesthetic complications: no  No notable events documented.   Last Vitals:  Vitals:   11/24/22 0850 11/24/22 0946  BP: (!) 153/98 133/88  Pulse: 83 97  Resp: 18 16  Temp: 36.7 C 37.1 C  SpO2: 98% 96%    Last Pain:  Vitals:   11/24/22 0946  TempSrc: Oral  PainSc: 0-No pain                 Jewelianna Pancoast C Marti Mclane

## 2022-11-24 NOTE — Transfer of Care (Signed)
Immediate Anesthesia Transfer of Care Note  Patient: Candice Campos  Procedure(s) Performed: ESOPHAGOGASTRODUODENOSCOPY (EGD) WITH PROPOFOL BIOPSY  Patient Location: Short Stay  Anesthesia Type:General  Level of Consciousness: awake, alert , and oriented  Airway & Oxygen Therapy: Patient Spontanous Breathing  Post-op Assessment: Report given to RN and Post -op Vital signs reviewed and stable  Post vital signs: Reviewed and stable  Last Vitals:  Vitals Value Taken Time  BP 133/88 11/24/22 0946  Temp 37.1 C 11/24/22 0946  Pulse 97 11/24/22 0946  Resp 16 11/24/22 0946  SpO2 96 % 11/24/22 0946    Last Pain:  Vitals:   11/24/22 0946  TempSrc: Oral  PainSc: 0-No pain         Complications: No notable events documented.

## 2022-11-25 LAB — SURGICAL PATHOLOGY

## 2022-12-01 ENCOUNTER — Encounter (HOSPITAL_COMMUNITY): Payer: Self-pay | Admitting: Internal Medicine

## 2023-01-30 ENCOUNTER — Ambulatory Visit (INDEPENDENT_AMBULATORY_CARE_PROVIDER_SITE_OTHER): Payer: Medicaid Other | Admitting: Gastroenterology

## 2023-01-30 ENCOUNTER — Encounter: Payer: Self-pay | Admitting: Gastroenterology

## 2023-01-30 VITALS — BP 126/78 | HR 73 | Temp 98.2°F | Ht 69.0 in | Wt 279.8 lb

## 2023-01-30 DIAGNOSIS — R1319 Other dysphagia: Secondary | ICD-10-CM

## 2023-01-30 DIAGNOSIS — R1013 Epigastric pain: Secondary | ICD-10-CM | POA: Diagnosis not present

## 2023-01-30 DIAGNOSIS — A048 Other specified bacterial intestinal infections: Secondary | ICD-10-CM | POA: Diagnosis not present

## 2023-01-30 DIAGNOSIS — K219 Gastro-esophageal reflux disease without esophagitis: Secondary | ICD-10-CM | POA: Diagnosis not present

## 2023-01-30 DIAGNOSIS — K5904 Chronic idiopathic constipation: Secondary | ICD-10-CM

## 2023-01-30 DIAGNOSIS — G8929 Other chronic pain: Secondary | ICD-10-CM

## 2023-01-30 NOTE — Progress Notes (Unsigned)
GI Office Note    Referring Provider: Ray Church, FNP Primary Care Physician:  Ray Church, [place] Primary Gastroenterologist: Elon Alas. Abbey Chatters, DO  Date:  01/30/2023  ID:  Candice Campos, DOB December 11, 1990, MRN [id number]   Chief Complaint   Chief Complaint  Patient presents with   Follow-up    Still having acid reflux and some chest pains.    History of Present Illness  Candice Campos is a 32 y.o. female with a history of anemia, anxiety, diabetes, GERD, hidradenitis suppurativa, HTN, PCOS, vitamin D deficiency presenting today for follow up.   Initial consult visit 11/09/22 with Dr. Abbey Chatters.  Reports chronic acid, moderate to severe and occurring on daily basis.  Associated with dysphagia, abdominal bloating, and chest/epigastric pain.  Noted to have cholecystectomy in 2017.  Does admit to daily BC powder use.  Usually 1/day, sometimes has taken 3-4/day.  Also with chronic constipation with BM every 5 to 7 days.  Has tried MiraLAX without relief in the past.  Having associated left upper quadrant abdominal pain and bloating.  No melena or hematochezia. Provided samples of Linzess 145 mcg daily for constipation.  Advised to limit NSAID use.  Scheduled for EGD to evaluate for stricture, Schatzki's ring, esophageal web, PUD, esophagitis, gastritis, H. pylori, duodenitis.  Switch from omeprazole to pantoprazole twice daily.  EGD 11/24/22: -gastritis s/p biopsy -normal esophagus -normal duodenum -biopsies with mild acute gastritis, reactive gastropathy.  -PPI BID -Avoid NSAIDs   Today: GERD, dysphagia - A week after her procedures she reported she tested positive for H. Pylori. Was treated with antibiotics for the H. Pylori. Still having chest pains but not like they were. If she gets bloated or chest feels tight she will have heart palpitations and feels like her chest is skipping a beat and gets a hot flash. Has chest burning. Reports the other night she was up all night with  feeling like her heart was racing and states she has been told that she has anxiety. That day she dose report she ate a lot of chocolate. Has tried to cut out the chocolate and eating late at night as she tends to not have symptoms when that occurs. Does not drink sodas. Will drink tea or mostly water. A lot of pain is mid sternum but at times hurts the side of her left chest and right chest. Sometimes she can burp hard at night and has relief during that time. Sometimes she can eat at 9 pm she can eat late without issue. Has been having a hard time tidings a pattern. Reports a provider at her PCP gave her something that started with a C and she reports it did help initially. Still using tums on a regular basis. Reports Nexium worked well for her in the past. No dysphagia. Does get nauseas but not vomiting.   Constipation - Usually going once, sometimes twice per day. Occasionally skips as day. Never needed the Linzess, just started going on her own.    Current Outpatient Medications  Medication Sig Dispense Refill   ezetimibe (ZETIA) 10 MG tablet Take 10 mg by mouth daily.     labetalol (NORMODYNE) 100 MG tablet Take 100 mg by mouth daily.     metFORMIN (GLUCOPHAGE) 500 MG tablet Take 500 mg by mouth 2 (two) times daily.     pantoprazole (PROTONIX) 40 MG tablet Take 1 tablet (40 mg total) by mouth 2 (two) times daily. 60 tablet 5   rosuvastatin (CRESTOR) 40 MG tablet Take  40 mg by mouth daily.     topiramate (TOPAMAX) 25 MG tablet Take 25 mg by mouth daily.     Vitamin D, Ergocalciferol, (DRISDOL) 1.25 MG (50000 UNIT) CAPS capsule Take 50,000 Units by mouth every 7 (seven) days.     No current facility-administered medications for this visit.    Past Medical History:  Diagnosis Date   Anemia    Anxiety    Diabetes mellitus without complication (HCC)    GERD (gastroesophageal reflux disease)    Hidradenitis suppurativa    Hypertension    PCOS (polycystic ovarian syndrome)    Vitamin D  deficiency     Past Surgical History:  Procedure Laterality Date   BIOPSY  11/24/2022   Procedure: BIOPSY;  Surgeon: Eloise Harman, DO;  Location: [facility];  Service: Endoscopy;;   CHOLECYSTECTOMY     ESOPHAGOGASTRODUODENOSCOPY (EGD) WITH PROPOFOL N/A 11/24/2022   Procedure: ESOPHAGOGASTRODUODENOSCOPY (EGD) WITH PROPOFOL;  Surgeon: Eloise Harman, DO;  Location: [facility];  Service: Endoscopy;  Laterality: N/A;  10:30am, asa 3    Family History  Problem Relation Age of Onset   Hypertension Mother    Diabetes Mother     Allergies as of 01/30/2023 - Review Complete 01/30/2023  Allergen Reaction Noted   Amoxicillin  01/11/2018   Corticosteroids  11/09/2022    Social History   Socioeconomic History   Marital status: Married    Spouse name: Not on file   Number of children: Not on file   Years of education: Not on file   Highest education level: Not on file  Occupational History   Not on file  Tobacco Use   Smoking status: Former    Packs/day: 1.00    Types: Cigarettes    Passive exposure: Past   Smokeless tobacco: Never  Vaping Use   Vaping Use: Never used  Substance and Sexual Activity   Alcohol use: No   Drug use: No   Sexual activity: Not Currently    Birth control/protection: None  Other Topics Concern   Not on file  Social History Narrative   Not on file   Social Determinants of Health   Financial Resource Strain: Not on file  Food Insecurity: Not on file  Transportation Needs: Not on file  Physical Activity: Not on file  Stress: Not on file  Social Connections: Not on file     Review of Systems   Gen: Denies fever, chills, anorexia. Denies fatigue, weakness, weight loss.  CV: Denies chest pain, palpitations, syncope, peripheral edema, and claudication. Resp: Denies dyspnea at rest, cough, wheezing, coughing up blood, and pleurisy. GI: See HPI Derm: Denies rash, itching, dry skin Psych: Denies depression, anxiety, memory loss,  confusion. No homicidal or suicidal ideation.  Heme: Denies bruising, bleeding, and enlarged lymph nodes.   Physical Exam   BP 126/78 (BP Location: Right Arm, Patient Position: Sitting, Cuff Size: Large)   Pulse 73   Temp 98.2 F (36.8 C) (Temporal)   Ht '5\' 9"'$  (1.753 m)   Wt 279 lb 12.8 oz (126.9 kg)   LMP 01/07/2023 (Approximate)   SpO2 98%   BMI 41.32 kg/m   General:   Alert and oriented. No distress noted. Pleasant and cooperative.  Head:  Normocephalic and atraumatic. Eyes:  Conjuctiva clear without scleral icterus. Mouth:  Oral mucosa pink and moist. Good dentition. No lesions. Lungs:  Clear to auscultation bilaterally. No wheezes, rales, or rhonchi. No distress.  Heart:  S1, S2 present without  murmurs appreciated.  Abdomen:  +BS, soft, non-tender and non-distended. No rebound or guarding. No HSM or masses noted. Rectal: *** Msk:  Symmetrical without gross deformities. Normal posture. Extremities:  Without edema. Neurologic:  Alert and  oriented x4 Psych:  Alert and cooperative. Normal mood and affect.   Assessment  Candice Campos is a 32 y.o. female with a history of anemia, anxiety, GERD, diabetes, HTN, PCOS, hidradenitis suppurativa, vitmain d deficiency*** presenting today for follow up.  [id number]: Dysphagia improved.  H. Pylori:   Constipation: Going almost daily without issue.  Never tried Linzess as she started going on her own.  Unclear of why she had some constipation previously.  PLAN   *** Stop PPI for 2 weeks, check H. Pylori stool antigen then will start Nexium 40 mg twice daily.  Take famotidine 20 mg BID, may use tums as needed.  GERD diet/lifestyle modifications Simethicone (gas ex, beano) as needed for bloating Follow up in 2 months.     Venetia Night, MSN, FNP-BC, AGACNP-BC [facility]

## 2023-01-30 NOTE — Patient Instructions (Addendum)
Stop pantoprazole. In 2 weeks give stool test to check for h. Pylori.  Then we will start Nexium 40 mg BID.   May take famotidine (Pepcid) 20 mg twice daily as needed. May take beano, gas ex, phazyme as needed during this time. .   Follow a GERD diet:  Avoid fried, fatty, greasy, spicy, citrus foods. Avoid caffeine and carbonated beverages. Avoid chocolate. Try eating 4-6 small meals a day rather than 3 large meals. Do not eat within 3 hours of laying down. Prop head of bed up on wood or bricks to create a 6 inch incline.  We will follow up in 2 months or sooner if needed.   It was a pleasure to see you today. I want to create trusting relationships with patients. If you receive a survey regarding your visit,  I greatly appreciate you taking time to fill this out on paper or through your MyChart. I value your feedback.  Venetia Night, MSN, FNP-BC, AGACNP-BC [facility]

## 2023-01-31 ENCOUNTER — Encounter: Payer: Self-pay | Admitting: Gastroenterology

## 2023-01-31 MED ORDER — ESOMEPRAZOLE MAGNESIUM 40 MG PO CPDR: 40.0000 mg | DELAYED_RELEASE_CAPSULE | Freq: Two times a day (BID) | ORAL | 2 refills | Status: DC

## 2023-02-10 ENCOUNTER — Telehealth: Payer: Self-pay | Admitting: *Deleted

## 2023-02-10 ENCOUNTER — Other Ambulatory Visit: Payer: Self-pay | Admitting: Gastroenterology

## 2023-02-10 DIAGNOSIS — A048 Other specified bacterial intestinal infections: Secondary | ICD-10-CM

## 2023-02-10 NOTE — Telephone Encounter (Signed)
Noted  

## 2023-02-10 NOTE — Telephone Encounter (Signed)
Pt called and states she needs to do H-Pylori breath test instead of stool test, because that [facility] did not do stool.

## 2023-02-16 LAB — H. PYLORI BREATH TEST: H pylori Breath Test: NEGATIVE

## 2023-02-21 ENCOUNTER — Other Ambulatory Visit: Payer: Self-pay | Admitting: Gastroenterology

## 2023-02-21 MED ORDER — RABEPRAZOLE SODIUM 20 MG PO TBEC: 20.0000 mg | DELAYED_RELEASE_TABLET | Freq: Two times a day (BID) | ORAL | 2 refills | Status: DC

## 2023-04-03 NOTE — Progress Notes (Unsigned)
GI Office Note    Referring Provider: Bridgette Habermann, FNP Primary Care Physician:  Bridgette Habermann, FNP Primary Gastroenterologist: Hennie Duos. Marletta Lor, DO  Date:  04/04/2023  ID:  Candice Campos, DOB July 29, 1991, MRN [id number]   Chief Complaint   Chief Complaint  Patient presents with   Follow-up    Patient here today for a follow up on her H pylori infection. Patient reports she has finished her antibiotics and is feeling much better.   History of Present Illness  Candice Campos is a 32 y.o. female with a history of anemia, anxiety, diabetes, GERD, hidradenitis suppurativa, HTN, PCOS, vitamin D deficiency, H. pylori s/p eradication presenting today for follow-up.  Initial consult visit 11/09/22 with Dr. Marletta Lor.  Reports chronic acid, moderate to severe and occurring on daily basis.  Associated with dysphagia, abdominal bloating, and chest/epigastric pain.  Noted to have cholecystectomy in 2017.  Does admit to daily BC powder use.  Usually 1/day, sometimes has taken 3-4/day.  Also with chronic constipation with BM every 5 to 7 days.  Has tried MiraLAX without relief in the past.  Having associated left upper quadrant abdominal pain and bloating.  No melena or hematochezia. Provided samples of Linzess 145 mcg daily for constipation.  Advised to limit NSAID use.  Scheduled for EGD to evaluate for stricture, Schatzki's ring, esophageal web, PUD, esophagitis, gastritis, H. pylori, duodenitis.  Switch from omeprazole to pantoprazole twice daily.   EGD 11/24/22: -gastritis s/p biopsy -normal esophagus -normal duodenum -biopsies with mild acute gastritis, reactive gastropathy.  -PPI BID -Avoid NSAIDs  Last office visit 01/30/2023.  Reportedly tested positive for H. pylori a week after her procedure despite having negative biopsies on EGD.  Reported ongoing chest pains but improved from prior.  Chest feels tight when she gets bloated and has heart palpitations.  At times wakes up at night  with this.  Has tried to cut back on chocolate and eating late at night as her symptoms are fairly well-controlled when she avoids those.  Having a lot of midsternal pain.  If able to [facility] she sometimes has relief.  Using Tums on a regular basis.  Nexium worked well for her in the past.  Denies any dysphagia but does have some mild nausea without vomiting.  Was never in need of the Linzess, she started going on her own, usually a bowel movement every 1-2 days. Advised to stop pantoprazole for 2 weeks, check H. pylori stool antigen and then start Nexium 40 mg twice daily.  Advised simethicone as needed for bloating.  Take famotidine 20 mg twice daily and Tums as needed. GERD diet.   Patient sent multiple MyChart messages beginning 02/19/2023 with floating stomach discomfort and tenderness with the feeling like she cannot breathe.  She is concerned about a hiatal hernia.  She reported ongoing bloating and heart palpitations when bending over.  She was advised to follow a low FODMAP diet and that we will check celiac and thyroid levels..  At this time on 02/20/2023 she was only taking famotidine twice daily given she did not feel the Nexium is helpful since she went to the ER.  She was sent in Aciphex to use twice daily along with famotidine.  She then reported pooping and yellow color in nature at times.  She also reports over burps on 02/23/2023.  She was advised to follow strict GERD diet and continue taking Aciphex.  Today:   Still having some acid reflux despite the Aciphex. Still having the sensation  of things stuck in chest or has a pressure in her chest mid substernal. At times has tenderness to middle chest at times. States yesterday she had some burping and had regurgitation into the back of her throat. Taking famotidine once daily mostly every day. Still has to use tums at night at times. At time she can wake up and have a lot of pressure on her chest and at times the tums will calm her symptoms and  she is able to go back to sleep. At times at night she can hear bubbling sounds in her chest and has pains in the back of her throat and the back of her throat hurts.   Constipation - Has some hard stool initially and then may have some loser stools behind it . Usually goes every day or every other day.   No melena or brbpr.   Has tried xanax for anxiety and zoloft. States zoloft made her feel crazy and made her anxiety worse. Has had a few anxiety attacks her and there but reports no significant events in 5-6 months. Scared to take klonopin and xanax.   Current Outpatient Medications  Medication Sig Dispense Refill   ezetimibe (ZETIA) 10 MG tablet Take 10 mg by mouth daily.     labetalol (NORMODYNE) 100 MG tablet Take 100 mg by mouth daily.     metFORMIN (GLUCOPHAGE) 500 MG tablet Take 500 mg by mouth 2 (two) times daily.     RABEprazole (ACIPHEX) 20 MG tablet Take 1 tablet (20 mg total) by mouth in the morning and at bedtime. 60 tablet 2   rosuvastatin (CRESTOR) 40 MG tablet Take 40 mg by mouth daily.     tirzepatide [facility]) 2.5 MG/0.5ML Pen Inject 2.5 mg into the skin once a week.     Vitamin D, Ergocalciferol, (DRISDOL) 1.25 MG (50000 UNIT) CAPS capsule Take 50,000 Units by mouth every 7 (seven) days.     No current facility-administered medications for this visit.    Past Medical History:  Diagnosis Date   Anemia    Anxiety    Diabetes mellitus without complication (HCC)    GERD (gastroesophageal reflux disease)    Hidradenitis suppurativa    Hypertension    PCOS (polycystic ovarian syndrome)    Vitamin D deficiency     Past Surgical History:  Procedure Laterality Date   BIOPSY  11/24/2022   Procedure: BIOPSY;  Surgeon: Lanelle Bal, DO;  [facility];  Service: Endoscopy;;   CHOLECYSTECTOMY     ESOPHAGOGASTRODUODENOSCOPY (EGD) WITH PROPOFOL N/A 11/24/2022   Procedure: ESOPHAGOGASTRODUODENOSCOPY (EGD) WITH PROPOFOL;  Surgeon: Lanelle Bal, DO;   [facility];  Service: Endoscopy;  Laterality: N/A;  10:30am, asa 3    Family History  Problem Relation Age of Onset   Hypertension Mother    Diabetes Mother     Allergies as of 04/04/2023 - Review Complete 04/04/2023  Allergen Reaction Noted   Amoxicillin  01/11/2018   Corticosteroids  11/09/2022    Social History   Socioeconomic History   Marital status: Married    Spouse name: Not on file   Number of children: Not on file   Years of education: Not on file   Highest education level: Not on file  Occupational History   Not on file  Tobacco Use   Smoking status: Former    Packs/day: 1    Types: Cigarettes    Passive exposure: Past   Smokeless tobacco: Never  Vaping Use  Vaping Use: Never used  Substance and Sexual Activity   Alcohol use: No   Drug use: No   Sexual activity: Not Currently    Birth control/protection: None  Other Topics Concern   Not on file  Social History Narrative   Not on file   Social Determinants of Health   Financial Resource Strain: Not on file  Food Insecurity: Not on file  Transportation Needs: Not on file  Physical Activity: Not on file  Stress: Not on file  Social Connections: Not on file     Review of Systems   Gen: Denies fever, chills, anorexia. Denies fatigue, weakness, weight loss.  CV: Denies chest pain, palpitations, syncope, peripheral edema, and claudication. Resp: Denies dyspnea at rest, cough, wheezing, coughing up blood, and pleurisy. GI: See HPI Derm: Denies rash, itching, dry skin Psych: Denies depression, anxiety, memory loss, confusion. No homicidal or suicidal ideation.  Heme: Denies bruising, bleeding, and enlarged lymph nodes.   Physical Exam   BP 114/78 (BP Location: Left Arm, Patient Position: Sitting, Cuff Size: Large)   Pulse 89   Temp 98 F (36.7 C) (Temporal)   Ht 5\' 8"  (1.727 m)   Wt 270 lb 4.8 oz (122.6 kg)   BMI 41.10 kg/m   General:   Alert and oriented. No distress noted.  Pleasant and cooperative.  Head:  Normocephalic and atraumatic. Eyes:  Conjuctiva clear without scleral icterus. Mouth:  Oral mucosa pink and moist. Good dentition. No lesions. Lungs:  Clear to auscultation bilaterally. No wheezes, rales, or rhonchi. No distress.  Heart:  S1, S2 present without murmurs appreciated.  Abdomen:  +BS, soft,non-distended. TTP across upper abdomen. No rebound or guarding. No HSM or masses noted. Rectal: deferred Msk:  Symmetrical without gross deformities. Normal posture. Extremities:  Without edema. Neurologic:  Alert and  oriented x4 Psych:  Alert and cooperative. Normal mood and affect.   Assessment  Candice Campos is a 32 y.o. female with a history of anemia, anxiety, diabetes, GERD, hidradenitis suppurativa, HTN, PCOS, vitamin D deficiency, H. pylori s/p eradication presenting today for follow-up.  GERD, H. Pylori, functional chest pain: EGD in December 2023 with gastritis an biopsy negative for H. Pylori. A few weeks after her EGD she tested positive for H. Pylori for which she has completed treatment and H. pylori breath test negative 02/14/2023. Has tried multiple PPIs including pantoprazole and Nexium without great control of reflux. Possibly still dietary component to symptoms as well. Recently with some very mild improvement with Aciphex. Will continue this for now and treat with carafate for throat burning and epigastric discomfort. Unfortunately not a candidate for TIF given BMI which we discussed today. Will obtain ph impendence and manometry to assess for motility issue or spasms as cause of burning and chest pain. Will do this on PPI to see if it is effective. May benefit from trial of nightly TCA given anxiety component or possibly baclofen. Advised gas ex of phazyme as needed for bloating naf discomfort with inability to burp.   Constipation: Fairly well controlled currently. Advised if more than 3-4 days without a BM that she should use miralax or  stool softener.   PLAN   Carafate 1g TID for 2 weeks. Continue Aciphex BID Continue famotidine, increase to BID if worsening symptoms.  Use extra strength Gas-X or Phazyme as needed.  No more than 500 mg of methadone daily. Referral to [facility] for manometry and ph impendence on PPI May benefit from low dose TCA vs baclofen  in the future for treatment of reflux.  Avoid NSAIDs Avoid common gas-producing foods. Advise she may use low-dose MiraLAX or stool softener as needed. Follow up in 3 months    Brooke Bonito, MSN, FNP-BC, AGACNP-BC [facility]

## 2023-04-04 ENCOUNTER — Ambulatory Visit (INDEPENDENT_AMBULATORY_CARE_PROVIDER_SITE_OTHER): Payer: Medicaid Other | Admitting: Gastroenterology

## 2023-04-04 ENCOUNTER — Encounter: Payer: Self-pay | Admitting: Gastroenterology

## 2023-04-04 VITALS — BP 114/78 | HR 89 | Temp 98.0°F | Ht 68.0 in | Wt 270.3 lb

## 2023-04-04 DIAGNOSIS — A048 Other specified bacterial intestinal infections: Secondary | ICD-10-CM

## 2023-04-04 DIAGNOSIS — R0789 Other chest pain: Secondary | ICD-10-CM | POA: Diagnosis not present

## 2023-04-04 DIAGNOSIS — R1013 Epigastric pain: Secondary | ICD-10-CM | POA: Diagnosis not present

## 2023-04-04 DIAGNOSIS — K5900 Constipation, unspecified: Secondary | ICD-10-CM

## 2023-04-04 DIAGNOSIS — K219 Gastro-esophageal reflux disease without esophagitis: Secondary | ICD-10-CM

## 2023-04-04 DIAGNOSIS — G8929 Other chronic pain: Secondary | ICD-10-CM

## 2023-04-04 MED ORDER — SUCRALFATE 1 G PO TABS: 1.0000 g | ORAL_TABLET | Freq: Three times a day (TID) | ORAL | 0 refills | Status: DC

## 2023-04-04 NOTE — Patient Instructions (Addendum)
Continue Aciphex 20 mg twice daily.  I am sending in Carafate for you to take 1 g 3 times daily for 2 weeks.  While taking Carafate you may use famotidine as needed.  When you finish the Carafate resume famotidine 20 mg twice daily.  Follow a GERD diet:  Avoid fried, fatty, greasy, spicy, citrus foods. Avoid caffeine and carbonated beverages. Avoid chocolate. Try eating 4-6 small meals a day rather than 3 large meals. Do not eat within 3 hours of laying down. Prop head of bed up on wood or bricks to create a 6 inch incline.   You may use extra strength Gas-X or Phazyme.  The active ingredient simethicone is in these 2 medications you should limit the total to 500 mg in 24 hours.  Avoid gas-producing foods (eg, cabbage, legumes, onions, broccoli, brussel sprouts, wheat, and potatoes).   I will be sending in a referral to [facility] for you to have manometry performed to further assess your reflux.  We will plan to follow-up in 3 months, sooner if needed.  It was a pleasure to see you today. I want to create trusting relationships with patients. If you receive a survey regarding your visit,  I greatly appreciate you taking time to fill this out on paper or through your MyChart. I value your feedback.  Brooke Bonito, MSN, FNP-BC, AGACNP-BC [facility]

## 2023-04-10 ENCOUNTER — Telehealth: Payer: Self-pay | Admitting: *Deleted

## 2023-04-10 NOTE — Telephone Encounter (Signed)
Received refill request for sucralfate. Pt last Ov 04/04/2023

## 2023-04-10 NOTE — Telephone Encounter (Signed)
Spoke to pharmacy, informed them it was for 1 time. No more refills.

## 2023-04-19 NOTE — Telephone Encounter (Signed)
Candice Campos, does she still need the referral to [facility] for [facility] and PH study since she is pregnant?

## 2023-06-05 ENCOUNTER — Telehealth: Payer: Self-pay | Admitting: *Deleted

## 2023-06-05 ENCOUNTER — Other Ambulatory Visit: Payer: Self-pay | Admitting: Gastroenterology

## 2023-06-05 MED ORDER — RABEPRAZOLE SODIUM 20 MG PO TBEC: 20.0000 mg | DELAYED_RELEASE_TABLET | Freq: Two times a day (BID) | ORAL | 3 refills | Status: DC

## 2023-06-05 NOTE — Telephone Encounter (Signed)
Received refill request for rabeprazole 20mg . Pt last OV 04/04/2023

## 2023-07-06 ENCOUNTER — Ambulatory Visit: Payer: Medicaid Other | Admitting: Gastroenterology

## 2023-07-06 ENCOUNTER — Encounter: Payer: Self-pay | Admitting: Gastroenterology

## 2023-07-06 NOTE — Progress Notes (Deleted)
GI Office Note    Referring Provider: Bridgette Habermann, FNP Primary Care Physician:  Bridgette Habermann, FNP Primary Gastroenterologist: Hennie Duos. Marletta Lor, DO  Date:  07/06/2023  ID:  Candice Campos, DOB 1991-05-12, MRN [id number]   Chief Complaint   No chief complaint on file.  History of Present Illness  Candice Campos is a 32 y.o. female with a history of anemia, anxiety, diabetes, GERD, H. Pylori s/p eradication, hidradenitis suppurativa, HTN, PCOS, vitamin D deficiency presenting today for follow up.    Initial consult visit 11/09/22 with Dr. Marletta Lor.  Reports chronic acid, moderate to severe and occurring on daily basis.  Associated with dysphagia, abdominal bloating, and chest/epigastric pain.  Noted to have cholecystectomy in 2017.  Does admit to daily BC powder use.  Usually 1/day, sometimes has taken 3-4/day.  Also with chronic constipation with BM every 5 to 7 days.  Has tried MiraLAX without relief in the past.  Having associated left upper quadrant abdominal pain and bloating.  No melena or hematochezia. Provided samples of Linzess 145 mcg daily for constipation.  Advised to limit NSAID use.  Scheduled for EGD to evaluate for stricture, Schatzki's ring, esophageal web, PUD, esophagitis, gastritis, H. pylori, duodenitis.  Switch from omeprazole to pantoprazole twice daily.   EGD 11/24/22: -gastritis s/p biopsy -normal esophagus -normal duodenum -biopsies with mild acute gastritis, reactive gastropathy.  -PPI BID -Avoid NSAIDs   Last office visit 01/30/2023.  Reportedly tested positive for H. pylori a week after her procedure despite having negative biopsies on EGD.  Reported ongoing chest pains but improved from prior.  Chest feels tight when she gets bloated and has heart palpitations.  At times wakes up at night with this.  Has tried to cut back on chocolate and eating late at night as her symptoms are fairly well-controlled when she avoids those.  Having a lot of midsternal  pain.  If able to [facility] she sometimes has relief.  Using Tums on a regular basis.  Nexium worked well for her in the past.  Denies any dysphagia but does have some mild nausea without vomiting.  Was never in need of the Linzess, she started going on her own, usually a bowel movement every 1-2 days. Advised to stop pantoprazole for 2 weeks, check H. pylori stool antigen and then start Nexium 40 mg twice daily.  Advised simethicone as needed for bloating.  Take famotidine 20 mg twice daily and Tums as needed. GERD diet.    Patient sent multiple MyChart messages beginning 02/19/2023 with floating stomach discomfort and tenderness with the feeling like she cannot breathe.  She is concerned about a hiatal hernia.  She reported ongoing bloating and heart palpitations when bending over.  She was advised to follow a low FODMAP diet and that we will check celiac and thyroid levels..  At this time on 02/20/2023 she was only taking famotidine twice daily given she did not feel the Nexium is helpful since she went to the ER.  She was sent in Aciphex to use twice daily along with famotidine.  She then reported pooping and yellow color in nature at times.  She also reports over burps on 02/23/2023.  She was advised to follow strict GERD diet and continue taking Aciphex.   Last office visit 04/04/23. ***  Patient found out she was pregnant on 5/21.    Current Outpatient Medications  Medication Sig Dispense Refill   ezetimibe (ZETIA) 10 MG tablet Take 10 mg by mouth daily.  labetalol (NORMODYNE) 100 MG tablet Take 100 mg by mouth daily.     metFORMIN (GLUCOPHAGE) 500 MG tablet Take 500 mg by mouth 2 (two) times daily.     RABEprazole (ACIPHEX) 20 MG tablet Take 1 tablet (20 mg total) by mouth in the morning and at bedtime. 180 tablet 3   rosuvastatin (CRESTOR) 40 MG tablet Take 40 mg by mouth daily.     sucralfate (CARAFATE) 1 g tablet Take 1 tablet (1 g total) by mouth 4 (four) times daily -  with meals and at  bedtime for 14 days. 56 tablet 0   tirzepatide (MOUNJARO) 2.5 MG/0.5ML Pen Inject 2.5 mg into the skin once a week.     Vitamin D, Ergocalciferol, (DRISDOL) 1.25 MG (50000 UNIT) CAPS capsule Take 50,000 Units by mouth every 7 (seven) days.     No current facility-administered medications for this visit.    Past Medical History:  Diagnosis Date   Anemia    Anxiety    Diabetes mellitus without complication (HCC)    GERD (gastroesophageal reflux disease)    Hidradenitis suppurativa    Hypertension    PCOS (polycystic ovarian syndrome)    Vitamin D deficiency     Past Surgical History:  Procedure Laterality Date   BIOPSY  11/24/2022   Procedure: BIOPSY;  Surgeon: Lanelle Bal, DO;  [facility];  Service: Endoscopy;;   CHOLECYSTECTOMY     ESOPHAGOGASTRODUODENOSCOPY (EGD) WITH PROPOFOL N/A 11/24/2022   Procedure: ESOPHAGOGASTRODUODENOSCOPY (EGD) WITH PROPOFOL;  Surgeon: Lanelle Bal, DO;  [facility];  Service: Endoscopy;  Laterality: N/A;  10:30am, asa 3    Family History  Problem Relation Age of Onset   Hypertension Mother    Diabetes Mother     Allergies as of 07/06/2023 - Review Complete 04/04/2023  Allergen Reaction Noted   Amoxicillin  01/11/2018   Corticosteroids  11/09/2022    Social History   Socioeconomic History   Marital status: Married    Spouse name: Not on file   Number of children: Not on file   Years of education: Not on file   Highest education level: Not on file  Occupational History   Not on file  Tobacco Use   Smoking status: Former    Current packs/day: 1.00    Types: Cigarettes    Passive exposure: Past   Smokeless tobacco: Never  Vaping Use   Vaping status: Never Used  Substance and Sexual Activity   Alcohol use: No   Drug use: No   Sexual activity: Not Currently    Birth control/protection: None  Other Topics Concern   Not on file  Social History Narrative   Not on file   Social Determinants of  Health   Financial Resource Strain: Not on file  Food Insecurity: Not on file  Transportation Needs: Not on file  Physical Activity: Not on file  Stress: Not on file  Social Connections: Not on file     Review of Systems   Gen: Denies fever, chills, anorexia. Denies fatigue, weakness, weight loss.  CV: Denies chest pain, palpitations, syncope, peripheral edema, and claudication. Resp: Denies dyspnea at rest, cough, wheezing, coughing up blood, and pleurisy. GI: See HPI Derm: Denies rash, itching, dry skin Psych: Denies depression, anxiety, memory loss, confusion. No homicidal or suicidal ideation.  Heme: Denies bruising, bleeding, and enlarged lymph nodes.   Physical Exam   There were no vitals taken for this visit.  General:   Alert  and oriented. No distress noted. Pleasant and cooperative.  Head:  Normocephalic and atraumatic. Eyes:  Conjuctiva clear without scleral icterus. Mouth:  Oral mucosa pink and moist. Good dentition. No lesions. Lungs:  Clear to auscultation bilaterally. No wheezes, rales, or rhonchi. No distress.  Heart:  S1, S2 present without murmurs appreciated.  Abdomen:  +BS, soft, non-tender and non-distended. No rebound or guarding. No HSM or masses noted. Rectal: *** Msk:  Symmetrical without gross deformities. Normal posture. Extremities:  Without edema. Neurologic:  Alert and  oriented x4 Psych:  Alert and cooperative. Normal mood and affect.   Assessment  Candice Campos is a 32 y.o. female with a history of anemia, anxiety, diabetes, GERD, H. Pylori s/p eradication, hidradenitis suppurativa, HTN, PCOS, vitamin D deficiency presenting today for follow up.    GERD, functional chest pain:   Constipation:   PLAN   ***     Brooke Bonito, MSN, FNP-BC, AGACNP-BC [facility]

## 2024-03-22 IMAGING — DX DG CHEST 2V
2 series · 2 of 2 positions shown · non-contrast
Comparison: 03/28/2018

CLINICAL DATA: Chest pain for the past 2 weeks.

EXAM:
CHEST - 2 VIEW

[chest pa]
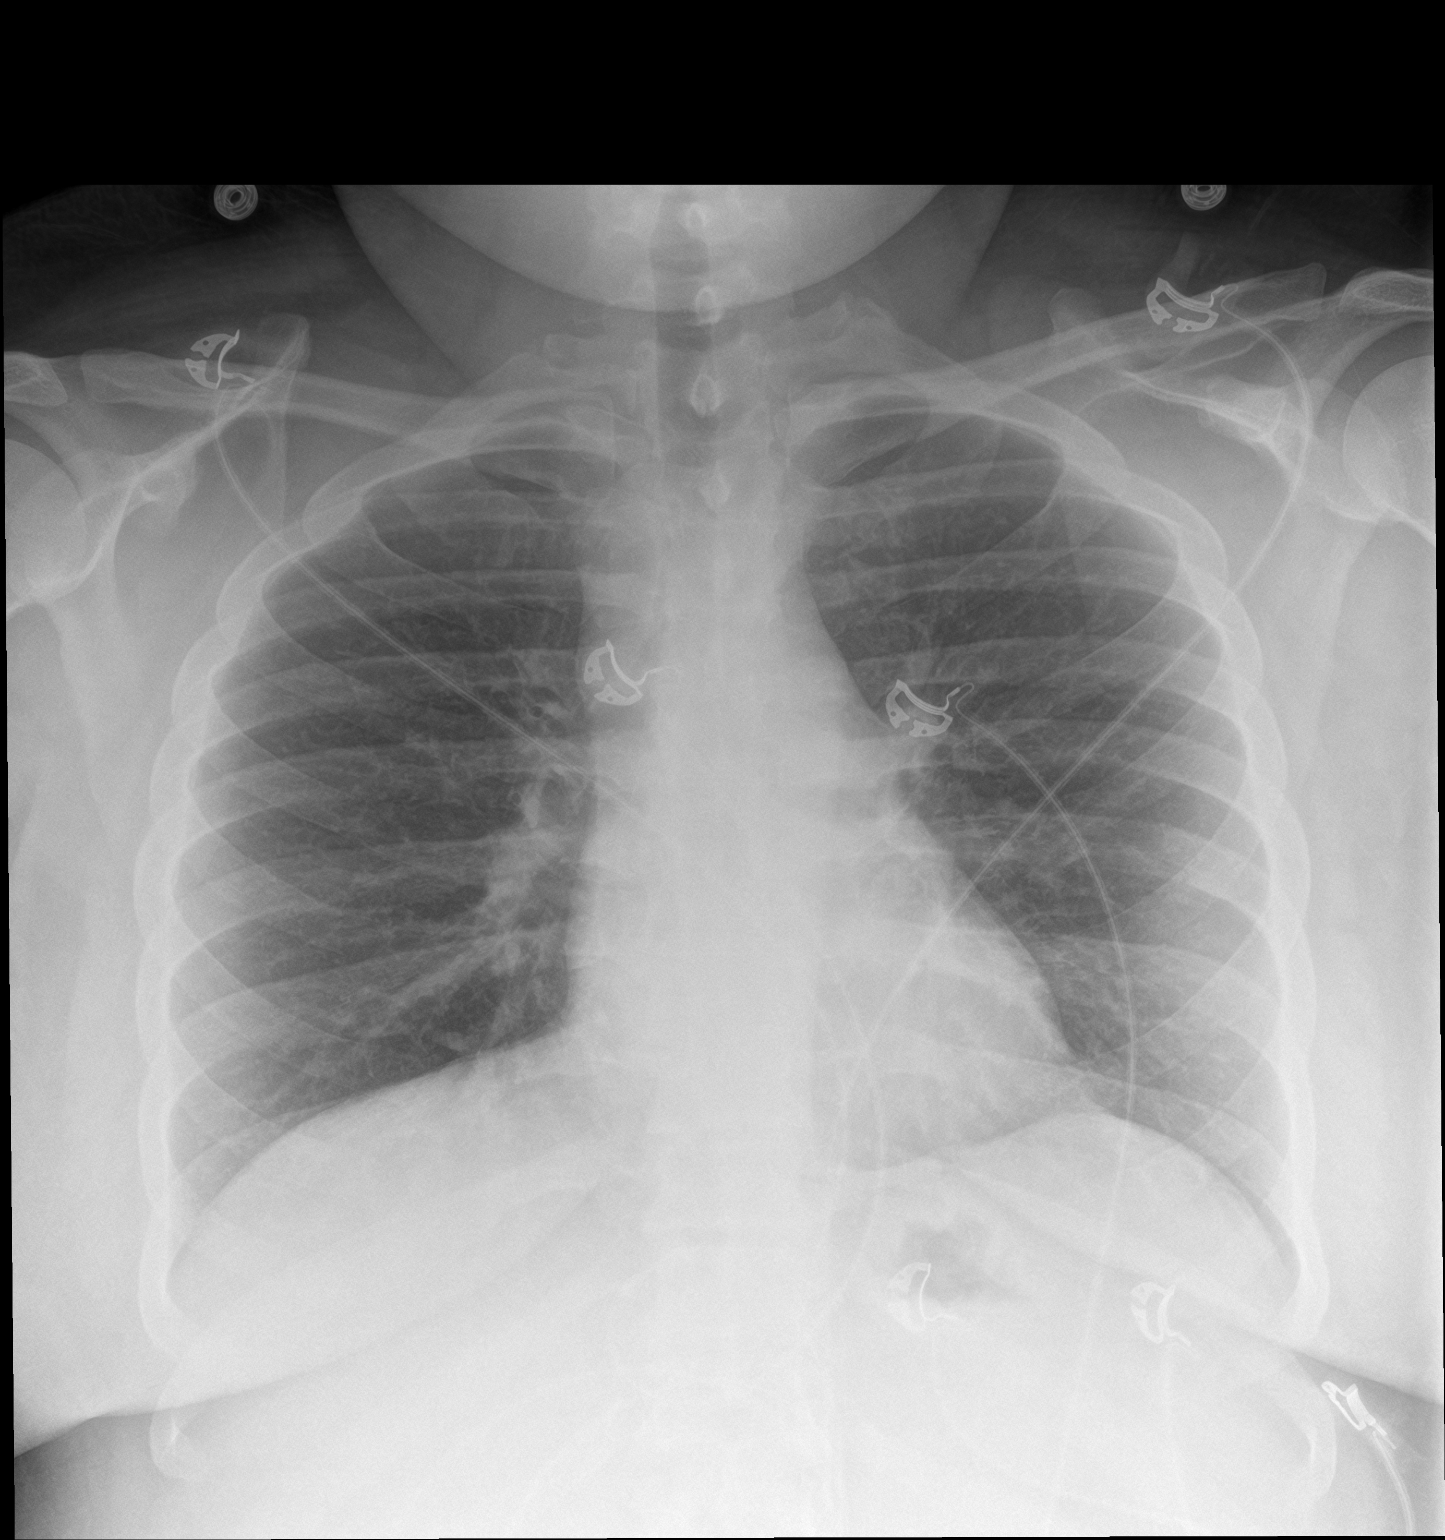

[chest lat]
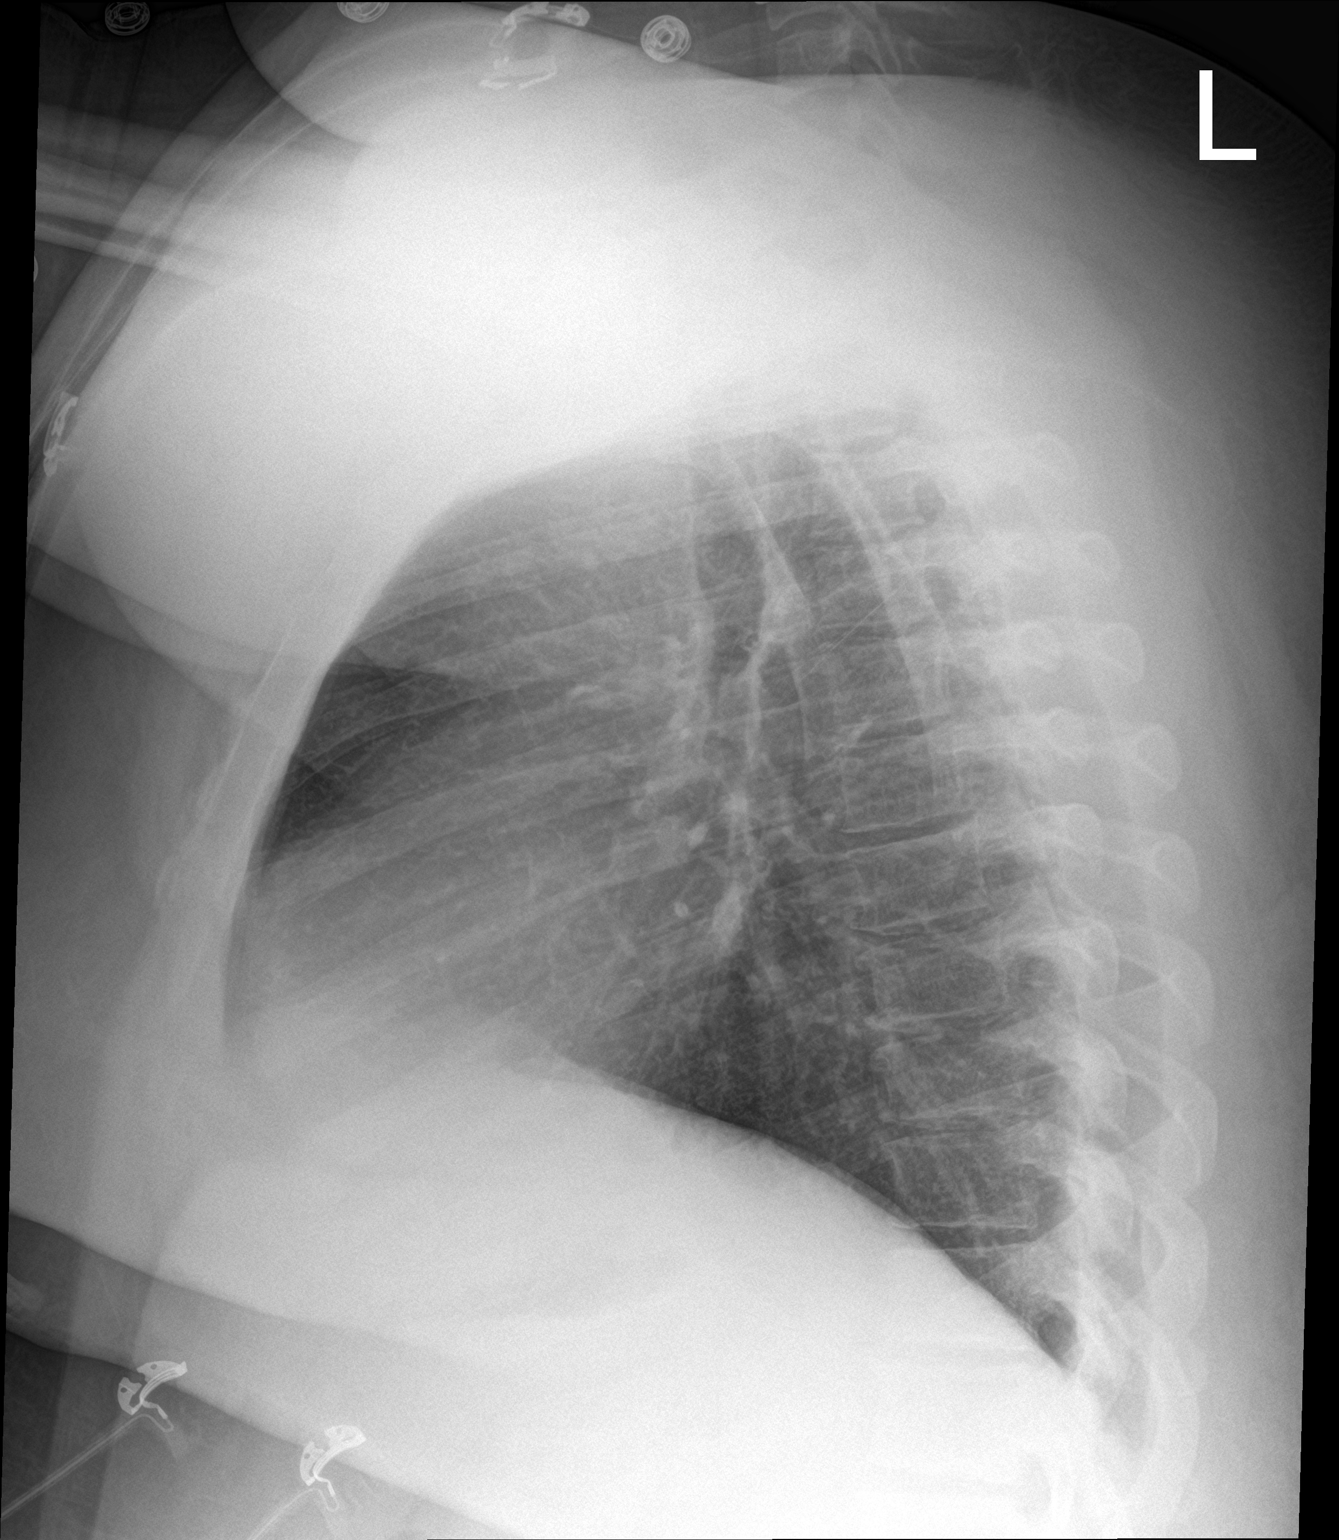

[2 of 2 positions shown; findings below may reference images not displayed]

FINDINGS: Examination is degraded due to patient body habitus. Grossly
unchanged cardiac silhouette and mediastinal contours. Thickening
the right paratracheal stripe is unchanged and presumably secondary
to prominent vasculature. No discrete focal airspace opacities. No
pleural effusion or pneumothorax. No evidence of edema. No acute
osseous abnormalities.
IMPRESSION: No acute cardiopulmonary disease on this body habitus degraded
examination.

## 2024-05-06 ENCOUNTER — Ambulatory Visit: Admitting: *Deleted

## 2024-05-06 ENCOUNTER — Ambulatory Visit

## 2024-05-06 ENCOUNTER — Other Ambulatory Visit: Payer: Self-pay | Admitting: Obstetrics & Gynecology

## 2024-05-06 VITALS — BP 118/79 | HR 85

## 2024-05-06 DIAGNOSIS — Z3A00 Weeks of gestation of pregnancy not specified: Secondary | ICD-10-CM

## 2024-05-06 DIAGNOSIS — O3680X0 Pregnancy with inconclusive fetal viability, not applicable or unspecified: Secondary | ICD-10-CM

## 2024-05-06 DIAGNOSIS — I10 Essential (primary) hypertension: Secondary | ICD-10-CM

## 2024-05-06 NOTE — Progress Notes (Signed)
 US  6+[redacted] wks gestational sac with yolk sac,no fetal pole visualized,GS 14.8 mm,normal ovaries,pt will come back in 2 wks for F/U

## 2024-05-06 NOTE — Progress Notes (Addendum)
   NURSE VISIT- BLOOD PRESSURE CHECK  SUBJECTIVE:  Candice Campos is a 33 y.o. G26P0010 female here for BP check. She is Unknown pregnant. Here for dating u/s.  Currently taking Lisinopril 10mg  daily.  Pre-eclampsia with last baby delivered 5 months ago and was taking 800mg  Labetalol TID at the end of the pregnancy.   HYPERTENSION ROS:  Pregnant:  Severe headaches that don't go away with tylenol /other medicines: No  Visual changes (seeing spots/double/blurred vision) No  Severe pain under right breast breast or in center of upper chest No  Severe nausea/vomiting No  Taking medicines as instructed not applicable   OBJECTIVE:  BP 118/79 (BP Location: Left Arm, Patient Position: Sitting, Cuff Size: Large)   Pulse 85   LMP  (LMP Unknown)   Appearance alert, well appearing, and in no distress.  ASSESSMENT: Pregnancy Unknown  blood pressure check  PLAN: Discussed with Dr. Randolm Butte   Recommendations: Start labetalol 200mg  BID-new prescription will be sent   Follow-up: 2 weeks for BP check   Candice Campos  05/06/2024 4:43 PM

## 2024-05-07 ENCOUNTER — Telehealth: Payer: Self-pay | Admitting: *Deleted

## 2024-05-07 MED ORDER — LABETALOL HCL 200 MG PO TABS: 200.0000 mg | ORAL_TABLET | Freq: Two times a day (BID) | ORAL | 3 refills | Status: DC

## 2024-05-07 NOTE — Telephone Encounter (Signed)
 Pt states she is in need of her blood pressure medication.

## 2024-05-08 ENCOUNTER — Encounter: Payer: Self-pay | Admitting: Women's Health

## 2024-05-08 MED ORDER — METHYLDOPA 250 MG PO TABS: 250.0000 mg | ORAL_TABLET | Freq: Three times a day (TID) | ORAL | 1 refills | Status: DC

## 2024-05-08 MED ORDER — NIFEDIPINE ER OSMOTIC RELEASE 30 MG PO TB24
30.0000 mg | ORAL_TABLET | Freq: Every day | ORAL | 1 refills | Status: DC
Start: 2024-05-08 — End: 2024-06-18

## 2024-05-08 NOTE — Addendum Note (Signed)
 Addended by: Wendelyn Halter on: 05/08/2024 02:28 PM   Modules accepted: Orders

## 2024-05-08 NOTE — Addendum Note (Signed)
 Addended by: Wendelyn Halter on: 05/08/2024 02:06 PM   Modules accepted: Orders

## 2024-05-10 ENCOUNTER — Telehealth: Payer: Self-pay | Admitting: *Deleted

## 2024-05-10 NOTE — Telephone Encounter (Signed)
 Patient called stating the pharmacy was unable to fill the Aldomet  as the dosage sent in is no longer available.   She was also not given the Procardia .  States she sent a message back to Dr Randolm Butte that she didn't want to take the Procardia  as is caused her to have swelling after delivery.  Discussed with Dr Ozan that the Procardia  most likely did not cause swelling as this is a common finding after delivery.  Advised to try the Procardia  and hold off on the Aldomet  but if develops symptoms, to let us  know. Pt verbalized understanding.

## 2024-05-22 ENCOUNTER — Other Ambulatory Visit: Payer: Self-pay | Admitting: Obstetrics & Gynecology

## 2024-05-22 DIAGNOSIS — O3680X0 Pregnancy with inconclusive fetal viability, not applicable or unspecified: Secondary | ICD-10-CM

## 2024-05-23 ENCOUNTER — Ambulatory Visit (INDEPENDENT_AMBULATORY_CARE_PROVIDER_SITE_OTHER)

## 2024-05-23 ENCOUNTER — Ambulatory Visit: Admitting: *Deleted

## 2024-05-23 VITALS — BP 121/82 | HR 84

## 2024-05-23 DIAGNOSIS — Z3A00 Weeks of gestation of pregnancy not specified: Secondary | ICD-10-CM | POA: Diagnosis not present

## 2024-05-23 DIAGNOSIS — Z3A08 8 weeks gestation of pregnancy: Secondary | ICD-10-CM

## 2024-05-23 DIAGNOSIS — I10 Essential (primary) hypertension: Secondary | ICD-10-CM

## 2024-05-23 DIAGNOSIS — O3680X0 Pregnancy with inconclusive fetal viability, not applicable or unspecified: Secondary | ICD-10-CM

## 2024-05-23 NOTE — Progress Notes (Signed)
 US  8+2 wks single IUP with yolk sac,CRL 17.87 mm,FHR 177 bpm,normal ovaries

## 2024-05-23 NOTE — Progress Notes (Signed)
   NURSE VISIT- BLOOD PRESSURE CHECK  SUBJECTIVE:  Candice Campos is a 33 y.o. G65P0010 female here for BP check. She [redacted]w[redacted]d pregnant.    HYPERTENSION ROS:  Pregnant:  Severe headaches that don't go away with tylenol /other medicines: No  Visual changes (seeing spots/double/blurred vision) No  Severe pain under right breast breast or in center of upper chest No  Severe nausea/vomiting No  Taking medicines as instructed yes   OBJECTIVE:  BP 121/82 (BP Location: Right Arm, Patient Position: Sitting, Cuff Size: Large)   Pulse 84   LMP  (LMP Unknown)   Appearance alert, well appearing, and in no distress.  ASSESSMENT: 8wk2d pregnant  blood pressure check  PLAN: Discussed with Dr. Jayne   Recommendations: no changes needed   Follow-up: as scheduled   Divon Krabill  05/24/2024 11:56 AM

## 2024-06-12 ENCOUNTER — Encounter: Payer: Self-pay | Admitting: Advanced Practice Midwife

## 2024-06-12 ENCOUNTER — Ambulatory Visit (INDEPENDENT_AMBULATORY_CARE_PROVIDER_SITE_OTHER): Admitting: Advanced Practice Midwife

## 2024-06-12 ENCOUNTER — Other Ambulatory Visit (HOSPITAL_COMMUNITY)
Admission: RE | Admit: 2024-06-12 | Discharge: 2024-06-12 | Disposition: A | Discharge status: Home / Self Care | Source: Ambulatory Visit | Attending: Advanced Practice Midwife | Admitting: Advanced Practice Midwife

## 2024-06-12 ENCOUNTER — Encounter: Admitting: *Deleted

## 2024-06-12 VITALS — BP 136/85 | HR 83 | Wt 273.0 lb

## 2024-06-12 DIAGNOSIS — Z363 Encounter for antenatal screening for malformations: Secondary | ICD-10-CM | POA: Diagnosis not present

## 2024-06-12 DIAGNOSIS — I10 Essential (primary) hypertension: Secondary | ICD-10-CM | POA: Diagnosis not present

## 2024-06-12 DIAGNOSIS — Z3401 Encounter for supervision of normal first pregnancy, first trimester: Secondary | ICD-10-CM

## 2024-06-12 DIAGNOSIS — E119 Type 2 diabetes mellitus without complications: Secondary | ICD-10-CM

## 2024-06-12 DIAGNOSIS — Z98891 History of uterine scar from previous surgery: Secondary | ICD-10-CM

## 2024-06-12 DIAGNOSIS — O0991 Supervision of high risk pregnancy, unspecified, first trimester: Secondary | ICD-10-CM

## 2024-06-12 DIAGNOSIS — Z3A11 11 weeks gestation of pregnancy: Secondary | ICD-10-CM | POA: Insufficient documentation

## 2024-06-12 DIAGNOSIS — Z8759 Personal history of other complications of pregnancy, childbirth and the puerperium: Secondary | ICD-10-CM

## 2024-06-12 DIAGNOSIS — O0990 Supervision of high risk pregnancy, unspecified, unspecified trimester: Secondary | ICD-10-CM | POA: Insufficient documentation

## 2024-06-12 MED ORDER — ASPIRIN 81 MG PO CHEW: 162.0000 mg | CHEWABLE_TABLET | Freq: Every day | ORAL | 7 refills | Status: DC

## 2024-06-12 NOTE — Patient Instructions (Signed)
Candice Campos, thank you for choosing our office today! We appreciate the opportunity to meet your healthcare needs. You may receive a short survey by mail, e-mail, or through [employer]. If you are happy with your care we would appreciate if you could take just a few minutes to complete the survey questions. We read all of your comments and take your feedback very seriously. Thank you again for choosing our office.  [facility] for [employer] [facility] Team at [facility]  [facility] ([address] [place], [place] [address]) Entrance C, located off of [address] Free 24/7 valet parking   Nausea & Vomiting Have saltine crackers or pretzels by your bed and eat a few bites before you raise your head out of bed in the morning Eat small frequent meals throughout the day instead of large meals Drink plenty of fluids throughout the day to stay hydrated, just don't drink a lot of fluids with your meals.  This can make your stomach fill up faster making you feel sick Do not brush your teeth right after you eat Products with real ginger are good for nausea, like ginger ale and ginger hard candy Make sure it says made with real ginger! Sucking on sour candy like lemon heads is also good for nausea If your prenatal vitamins make you nauseated, take them at night so you will sleep through the nausea Sea Bands If you feel like you need medicine for the nausea & vomiting please let us know If you are unable to keep any fluids or food down please let us know   Constipation Drink plenty of fluid, preferably water, throughout the day Eat foods high in fiber such as fruits, vegetables, and grains Exercise, such as walking, is a good way to keep your bowels regular Drink warm fluids, especially warm prune juice, or decaf coffee Eat a 1/2 cup of real oatmeal (not instant), 1/2 cup applesauce, and 1/2-1 cup warm prune juice every day If needed, you may take Colace (docusate sodium) stool  softener once or twice a day to help keep the stool soft.  If you still are having problems with constipation, you may take Miralax once daily as needed to help keep your bowels regular.   Home Blood Pressure Monitoring for Patients   Your provider has recommended that you check your blood pressure (BP) at least once a week at home. If you do not have a blood pressure cuff at home, one will be provided for you. Contact your provider if you have not received your monitor within 1 week.   Helpful Tips for Accurate Home Blood Pressure Checks  Don't smoke, exercise, or drink caffeine 30 minutes before checking your BP Use the restroom before checking your BP (a full bladder can raise your pressure) Relax in a comfortable upright chair Feet on the ground Left arm resting comfortably on a flat surface at the level of your heart Legs uncrossed Back supported Sit quietly and don't talk Place the cuff on your bare arm Adjust snuggly, so that only two fingertips can fit between your skin and the top of the cuff Check 2 readings separated by at least one minute Keep a log of your BP readings For a visual, please reference this diagram: http://ccnc.care/bpdiagram  Provider Name: [facility]     Phone: [phone]  Zone 1: ALL CLEAR  Continue to monitor your symptoms:  BP reading is less than 140 (top number) or less than 90 (bottom  number)  No right upper stomach pain No headaches or seeing spots No feeling nauseated or throwing up No swelling in face and hands  Zone 2: CAUTION Call your doctor's office for any of the following:  BP reading is greater than 140 (top number) or greater than 90 (bottom number)  Stomach pain under your ribs in the middle or right side Headaches or seeing spots Feeling nauseated or throwing up Swelling in face and hands  Zone 3: EMERGENCY  Seek immediate medical care if you have any of the following:  BP reading is greater than160 (top number) or  greater than 110 (bottom number) Severe headaches not improving with Tylenol Serious difficulty catching your breath Any worsening symptoms from Zone 2    First Trimester of Pregnancy The first trimester of pregnancy is from week 1 until the end of week 12 (months 1 through 3). A week after a sperm fertilizes an egg, the egg will implant on the wall of the uterus. This embryo will begin to develop into a baby. Genes from you and your partner are forming the baby. The female genes determine whether the baby is a boy or a girl. At 6-8 weeks, the eyes and face are formed, and the heartbeat can be seen on ultrasound. At the end of 12 weeks, all the baby's organs are formed.  Now that you are pregnant, you will want to do everything you can to have a healthy baby. Two of the most important things are to get good prenatal care and to follow your health care provider's instructions. Prenatal care is all the medical care you receive before the baby's birth. This care will help prevent, find, and treat any problems during the pregnancy and childbirth. BODY CHANGES Your body goes through many changes during pregnancy. The changes vary from woman to woman.  You may gain or lose a couple of pounds at first. You may feel sick to your stomach (nauseous) and throw up (vomit). If the vomiting is uncontrollable, call your health care provider. You may tire easily. You may develop headaches that can be relieved by medicines approved by your health care provider. You may urinate more often. Painful urination may mean you have a bladder infection. You may develop heartburn as a result of your pregnancy. You may develop constipation because certain hormones are causing the muscles that push waste through your intestines to slow down. You may develop hemorrhoids or swollen, bulging veins (varicose veins). Your breasts may begin to grow larger and become tender. Your nipples may stick out more, and the tissue that  surrounds them (areola) may become darker. Your gums may bleed and may be sensitive to brushing and flossing. Dark spots or blotches (chloasma, mask of pregnancy) may develop on your face. This will likely fade after the baby is born. Your menstrual periods will stop. You may have a loss of appetite. You may develop cravings for certain kinds of food. You may have changes in your emotions from day to day, such as being excited to be pregnant or being concerned that something may go wrong with the pregnancy and baby. You may have more vivid and strange dreams. You may have changes in your hair. These can include thickening of your hair, rapid growth, and changes in texture. Some women also have hair loss during or after pregnancy, or hair that feels dry or thin. Your hair will most likely return to normal after your baby is born. WHAT TO EXPECT AT YOUR PRENATAL  VISITS During a routine prenatal visit: You will be weighed to make sure you and the baby are growing normally. Your blood pressure will be taken. Your abdomen will be measured to track your baby's growth. The fetal heartbeat will be listened to starting around week 10 or 12 of your pregnancy. Test results from any previous visits will be discussed. Your health care provider may ask you: How you are feeling. If you are feeling the baby move. If you have had any abnormal symptoms, such as leaking fluid, bleeding, severe headaches, or abdominal cramping. If you have any questions. Other tests that may be performed during your first trimester include: Blood tests to find your blood type and to check for the presence of any previous infections. They will also be used to check for low iron levels (anemia) and Rh antibodies. Later in the pregnancy, blood tests for diabetes will be done along with other tests if problems develop. Urine tests to check for infections, diabetes, or protein in the urine. An ultrasound to confirm the proper growth  and development of the baby. An amniocentesis to check for possible genetic problems. Fetal screens for spina bifida and Down syndrome. You may need other tests to make sure you and the baby are doing well. HOME CARE INSTRUCTIONS  Medicines Follow your health care provider's instructions regarding medicine use. Specific medicines may be either safe or unsafe to take during pregnancy. Take your prenatal vitamins as directed. If you develop constipation, try taking a stool softener if your health care provider approves. Diet Eat regular, well-balanced meals. Choose a variety of foods, such as meat or vegetable-based protein, fish, milk and low-fat dairy products, vegetables, fruits, and whole grain breads and cereals. Your health care provider will help you determine the amount of weight gain that is right for you. Avoid raw meat and uncooked cheese. These carry germs that can cause birth defects in the baby. Eating four or five small meals rather than three large meals a day may help relieve nausea and vomiting. If you start to feel nauseous, eating a few soda crackers can be helpful. Drinking liquids between meals instead of during meals also seems to help nausea and vomiting. If you develop constipation, eat more high-fiber foods, such as fresh vegetables or fruit and whole grains. Drink enough fluids to keep your urine clear or pale yellow. Activity and Exercise Exercise only as directed by your health care provider. Exercising will help you: Control your weight. Stay in shape. Be prepared for labor and delivery. Experiencing pain or cramping in the lower abdomen or low back is a good sign that you should stop exercising. Check with your health care provider before continuing normal exercises. Try to avoid standing for long periods of time. Move your legs often if you must stand in one place for a long time. Avoid heavy lifting. Wear low-heeled shoes, and practice good posture. You may  continue to have sex unless your health care provider directs you otherwise. Relief of Pain or Discomfort Wear a good support bra for breast tenderness.   Take warm sitz baths to soothe any pain or discomfort caused by hemorrhoids. Use hemorrhoid cream if your health care provider approves.   Rest with your legs elevated if you have leg cramps or low back pain. If you develop varicose veins in your legs, wear support hose. Elevate your feet for 15 minutes, 3-4 times a day. Limit salt in your diet. Prenatal Care Schedule your prenatal visits by the  twelfth week of pregnancy. They are usually scheduled monthly at first, then more often in the last 2 months before delivery. Write down your questions. Take them to your prenatal visits. Keep all your prenatal visits as directed by your health care provider. Safety Wear your seat belt at all times when driving. Make a list of emergency phone numbers, including numbers for family, friends, the hospital, and police and fire departments. General Tips Ask your health care provider for a referral to a local prenatal education class. Begin classes no later than at the beginning of month 6 of your pregnancy. Ask for help if you have counseling or nutritional needs during pregnancy. Your health care provider can offer advice or refer you to specialists for help with various needs. Do not use hot tubs, steam rooms, or saunas. Do not douche or use tampons or scented sanitary pads. Do not cross your legs for long periods of time. Avoid cat litter boxes and soil used by cats. These carry germs that can cause birth defects in the baby and possibly loss of the fetus by miscarriage or stillbirth. Avoid all smoking, herbs, alcohol, and medicines not prescribed by your health care provider. Chemicals in these affect the formation and growth of the baby. Schedule a dentist appointment. At home, brush your teeth with a soft toothbrush and be gentle when you floss. SEEK  MEDICAL CARE IF:  You have dizziness. You have mild pelvic cramps, pelvic pressure, or nagging pain in the abdominal area. You have persistent nausea, vomiting, or diarrhea. You have a bad smelling vaginal discharge. You have pain with urination. You notice increased swelling in your face, hands, legs, or ankles. SEEK IMMEDIATE MEDICAL CARE IF:  You have a fever. You are leaking fluid from your vagina. You have spotting or bleeding from your vagina. You have severe abdominal cramping or pain. You have rapid weight gain or loss. You vomit blood or material that looks like coffee grounds. You are exposed to [place] measles and have never had them. You are exposed to fifth disease or chickenpox. You develop a severe headache. You have shortness of breath. You have any kind of trauma, such as from a fall or a car accident. Document Released: 11/08/2001 Document Revised: 03/31/2014 Document Reviewed: 09/24/2013 [facility] Patient Information 2015 [place], [place]. This information is not intended to replace advice given to you by your health care provider. Make sure you discuss any questions you have with your health care provider.

## 2024-06-12 NOTE — Progress Notes (Signed)
 INITIAL OBSTETRICAL VISIT Patient name: Candice Campos MRN [id number]  Date of birth: [birth year] Chief Complaint:   Initial Prenatal Visit  History of Present Illness:   Candice Campos is a 33 y.o. [phone] Caucasian female at [redacted]w[redacted]d by US  at 8.2 weeks with an Estimated Date of Delivery: 12/31/24 being seen today for her initial obstetrical visit.   No LMP recorded (lmp unknown). Patient is pregnant. Her obstetrical history is significant for pLTCS @ 34wks for fetal intolerance in setting of severe pre-e with labor induction.   Today she reports nausea.  Last pap August 2024. Results were: NILM w/ HRHPV not done ([facility])     06/12/2024    9:46 AM  Depression screen PHQ 2/9  Decreased Interest 0  Down, Depressed, Hopeless 0  PHQ - 2 Score 0  Altered sleeping 0  Tired, decreased energy 0  Change in appetite 0  Feeling bad or failure about yourself  0  Trouble concentrating 0  Moving slowly or fidgety/restless 0  Suicidal thoughts 0  PHQ-9 Score 0        06/12/2024    9:46 AM  GAD 7 : Generalized Anxiety Score  Nervous, Anxious, on Edge 0  Control/stop worrying 0  Worry too much - different things 0  Trouble relaxing 0  Restless 0  Easily annoyed or irritable 0  Afraid - awful might happen 0  Total GAD 7 Score 0     Review of Systems:   Pertinent items are noted in HPI Denies cramping/contractions, leakage of fluid, vaginal bleeding, abnormal vaginal discharge w/ itching/odor/irritation, headaches, visual changes, shortness of breath, chest pain, abdominal pain, severe nausea/vomiting, or problems with urination or bowel movements unless otherwise stated above.  Pertinent History Reviewed:  Reviewed past medical,surgical, social, obstetrical and family history.  Reviewed problem list, medications and allergies. OB History  Gravida Para Term Preterm AB Living  3 1 0 1 1 1   SAB IAB Ectopic Multiple Live Births  1 0 0 0 1    # Outcome Date GA Lbr  Len/2nd Weight Sex Type Anes PTL Lv  3 Current           2 Preterm 11/11/23 [redacted]w[redacted]d   F CS-LTranv  Y LIV     Complications: Fetal Intolerance  1 SAB 12/04/21 [redacted]w[redacted]d          Physical Assessment:   Vitals:   06/12/24 0925 06/12/24 0941  BP: (!) 143/79 136/85  Pulse: 83   Weight: 273 lb (123.8 kg)   Body mass index is 41.51 kg/m.       Physical Examination:  General appearance - well appearing, and in no distress  Mental status - alert, oriented to person, place, and time  Psych:  She has a normal mood and affect  Skin - warm and dry, normal color, no suspicious lesions noted  Chest - effort normal, all lung fields clear to auscultation bilaterally  Heart - normal rate and regular rhythm  Abdomen - soft, tender above low transverse incision; FHTs @ 154bpm  Extremities:  No swelling or varicosities noted  Pelvic - not examined  Thin prep pap is not done    No results found for this or any previous visit (from the past 24 hours).  Assessment & Plan:  1) High-Risk Pregnancy [id number] at [redacted]w[redacted]d with an Estimated Date of Delivery: 12/31/24   2) Initial OB visit  3) T2DM, taking Metformin 500mg  bid; to log qid values and bring to MD visit  in 2 weeks to evaluate  4) cHTN, seems to be stable BP on Procardia  XL 30mg , rx bASA 162mg /day and bring log to next visit  5) Intermittent tachycardia, states only happens during pregnancy and sometimes resolved after having a bowel movement; suspicious for SVT?; referred to Dr Sheena  6) Morbid obesity, PG BMI 42  7) pLTCS in Dec 2024 @ 34wks for fetal intolerance, wants TOLAC; reviewed is only 6 months PP which gives increased risk for uterine rupture; denies wound infection, but reports had a 'blister' recently in the middle of her incision which 'popped' during her dating u/s and is now very tender; will need to discuss delivery plans with MD  8) Hx severe pre-e requiring 34wk IOL  Meds:  Meds ordered this encounter  Medications   aspirin  81 MG  chewable tablet    Sig: Chew 2 tablets (162 mg total) by mouth daily.    Dispense:  60 tablet    Refill:  7    Supervising Provider:   MARILYNN NEST [[id number]]    Initial labs obtained Continue prenatal vitamins Reviewed n/v relief measures and warning s/s to report Reviewed recommended weight gain based on pre-gravid BMI Encouraged well-balanced diet Genetic & carrier screening discussed: requests Panorama, AFP, and Horizon , declines NT/IT Ultrasound discussed; fetal survey: requested CCNC completed> form faxed if has or is planning to apply for medicaid The nature of [place] [facility] [employer] with multiple MDs and other Advanced Practice Providers was explained to patient; also emphasized that fellows, residents, and students are part of our team. Does have home bp cuff. Office bp cuff given: no. Rx sent: n/a. Check bp weekly, let us  know if consistently >140/90.   Indications for ASA therapy (per uptodate) One of the following: H/O preeclampsia, especially early onset/adverse outcome Yes T1DM or T2DM Yes  Follow-up: Return for 2wk HROB MD (review blood sugars); 4wk HROB MD; 8wk HROB MD and anatomy u/s & AFP.   Orders Placed This Encounter  Procedures   Urine Culture   US  OB Comp + 14 Wk   CBC/D/Plt+RPR+Rh+ABO+RubIgG...   Comprehensive metabolic panel with GFR   Hemoglobin A1c   HORIZON CUSTOM   PANORAMA PRENATAL TEST   Protein / creatinine ratio, urine   AMB Referral to Cardio Obstetrics    Suzen JONETTA Gentry [facility] 06/12/2024 10:17 AM

## 2024-06-13 ENCOUNTER — Encounter: Payer: Self-pay | Admitting: Women's Health

## 2024-06-13 LAB — CBC/D/PLT+RPR+RH+ABO+RUBIGG...
Antibody Screen: NEGATIVE
Basophils Absolute: 0 x10E3/uL (ref 0.0–0.2)
Basos: 0 %
EOS (ABSOLUTE): 0.1 x10E3/uL (ref 0.0–0.4)
Eos: 1 %
HCV Ab: NONREACTIVE
HIV Screen 4th Generation wRfx: NONREACTIVE
Hematocrit: 32.7 % — ABNORMAL LOW (ref 34.0–46.6)
Hemoglobin: 10.4 g/dL — ABNORMAL LOW (ref 11.1–15.9)
Hepatitis B Surface Ag: NEGATIVE
Immature Grans (Abs): 0 x10E3/uL (ref 0.0–0.1)
Immature Granulocytes: 0 %
Lymphocytes Absolute: 1.3 x10E3/uL (ref 0.7–3.1)
Lymphs: 17 %
MCH: 23.9 pg — ABNORMAL LOW (ref 26.6–33.0)
MCHC: 31.8 g/dL (ref 31.5–35.7)
MCV: 75 fL — ABNORMAL LOW (ref 79–97)
Monocytes Absolute: 0.4 x10E3/uL (ref 0.1–0.9)
Monocytes: 5 %
Neutrophils Absolute: 6.1 x10E3/uL (ref 1.4–7.0)
Neutrophils: 77 %
Platelets: 319 x10E3/uL (ref 150–450)
RBC: 4.36 x10E6/uL (ref 3.77–5.28)
RDW: 20.9 % — ABNORMAL HIGH (ref 11.7–15.4)
RPR Ser Ql: NONREACTIVE
Rh Factor: POSITIVE
Rubella Antibodies, IGG: 1.17 {index} (ref >0.99)
WBC: 8 x10E3/uL (ref 3.4–10.8)

## 2024-06-13 LAB — COMPREHENSIVE METABOLIC PANEL WITH GFR
ALT: 18 IU/L (ref 0–32)
AST: 12 IU/L (ref 0–40)
Albumin: 4 g/dL (ref 3.9–4.9)
Alkaline Phosphatase: 81 IU/L (ref 44–121)
BUN/Creatinine Ratio: 17 (ref 9–23)
BUN: 8 mg/dL (ref 6–20)
Bilirubin Total: 0.2 mg/dL (ref 0.0–1.2)
CO2: 18 mmol/L — ABNORMAL LOW (ref 20–29)
Calcium: 9.5 mg/dL (ref 8.7–10.2)
Chloride: 103 mmol/L (ref 96–106)
Creatinine, Ser: 0.47 mg/dL — ABNORMAL LOW (ref 0.57–1.00)
Globulin, Total: 2.6 g/dL (ref 1.5–4.5)
Glucose: 102 mg/dL — ABNORMAL HIGH (ref 70–99)
Potassium: 3.9 mmol/L (ref 3.5–5.2)
Sodium: 137 mmol/L (ref 134–144)
Total Protein: 6.6 g/dL (ref 6.0–8.5)
eGFR: 130 mL/min/1.73 (ref >59)

## 2024-06-13 LAB — CERVICOVAGINAL ANCILLARY ONLY
Chlamydia: NEGATIVE
Comment: NEGATIVE
Comment: NORMAL
Neisseria Gonorrhea: NEGATIVE

## 2024-06-13 LAB — PROTEIN / CREATININE RATIO, URINE
Creatinine, Urine: 137.8 mg/dL
Protein, Ur: 19.7 mg/dL
Protein/Creat Ratio: 143 mg/g{creat} (ref 0–200)

## 2024-06-13 LAB — HCV INTERPRETATION

## 2024-06-13 LAB — HEMOGLOBIN A1C
Est. average glucose Bld gHb Est-mCnc: 108 mg/dL
Hgb A1c MFr Bld: 5.4 % (ref 4.8–5.6)

## 2024-06-14 LAB — URINE CULTURE

## 2024-06-16 ENCOUNTER — Other Ambulatory Visit: Payer: Self-pay | Admitting: Advanced Practice Midwife

## 2024-06-16 ENCOUNTER — Ambulatory Visit: Payer: Self-pay | Admitting: Advanced Practice Midwife

## 2024-06-16 DIAGNOSIS — O99019 Anemia complicating pregnancy, unspecified trimester: Secondary | ICD-10-CM

## 2024-06-16 MED ORDER — FERROUS FUMARATE 324 MG PO TABS: 1.0000 | ORAL_TABLET | ORAL | 4 refills | Status: DC

## 2024-06-18 ENCOUNTER — Other Ambulatory Visit: Payer: Self-pay | Admitting: *Deleted

## 2024-06-18 LAB — PANORAMA PRENATAL TEST FULL PANEL:PANORAMA TEST PLUS 5 ADDITIONAL MICRODELETIONS: FETAL FRACTION: 3.6

## 2024-06-18 MED ORDER — NIFEDIPINE ER OSMOTIC RELEASE 30 MG PO TB24: 30.0000 mg | ORAL_TABLET | Freq: Every day | ORAL | 6 refills | Status: DC

## 2024-06-20 LAB — HORIZON CUSTOM: REPORT SUMMARY: NEGATIVE

## 2024-06-26 ENCOUNTER — Encounter: Payer: Self-pay | Admitting: Obstetrics & Gynecology

## 2024-06-26 ENCOUNTER — Ambulatory Visit: Admitting: Obstetrics & Gynecology

## 2024-06-26 VITALS — BP 131/82 | HR 98 | Wt 271.0 lb

## 2024-06-26 DIAGNOSIS — Z3A13 13 weeks gestation of pregnancy: Secondary | ICD-10-CM | POA: Diagnosis not present

## 2024-06-26 DIAGNOSIS — K219 Gastro-esophageal reflux disease without esophagitis: Secondary | ICD-10-CM | POA: Diagnosis not present

## 2024-06-26 DIAGNOSIS — O0991 Supervision of high risk pregnancy, unspecified, first trimester: Secondary | ICD-10-CM | POA: Diagnosis not present

## 2024-06-26 DIAGNOSIS — O24311 Unspecified pre-existing diabetes mellitus in pregnancy, first trimester: Secondary | ICD-10-CM

## 2024-06-26 DIAGNOSIS — O0990 Supervision of high risk pregnancy, unspecified, unspecified trimester: Secondary | ICD-10-CM

## 2024-06-26 DIAGNOSIS — Z98891 History of uterine scar from previous surgery: Secondary | ICD-10-CM

## 2024-06-26 DIAGNOSIS — O24319 Unspecified pre-existing diabetes mellitus in pregnancy, unspecified trimester: Secondary | ICD-10-CM

## 2024-06-26 DIAGNOSIS — I10 Essential (primary) hypertension: Secondary | ICD-10-CM

## 2024-06-26 MED ORDER — METFORMIN HCL 500 MG PO TABS: 1000.0000 mg | ORAL_TABLET | Freq: Two times a day (BID) | ORAL | 11 refills | Status: DC

## 2024-06-26 MED ORDER — FAMOTIDINE 20 MG PO TABS: 20.0000 mg | ORAL_TABLET | Freq: Two times a day (BID) | ORAL | 11 refills | Status: AC

## 2024-06-26 NOTE — Progress Notes (Signed)
 HIGH-RISK PREGNANCY VISIT Patient name: Candice Campos MRN [id number]  Date of birth: 1991/11/08 Chief Complaint:   Routine Prenatal Visit (Review blood sugars)  History of Present Illness:   Candice Campos is a 33 y.o. [phone] female at [redacted]w[redacted]d with an Estimated Date of Delivery: 12/31/24 being seen today for ongoing management of a high-risk pregnancy complicated by:  - Class B DM Currently on metformin  500 mg daily  - Prior C-section, desires TOLAC -Chronic HTN with h/o preE  Morbid obesity  Today she reports no complaints.   Denies vaginal bleeding.  Denies contractions.  Too early for fetal movement.  Denies loss of fluid     06/12/2024    9:46 AM  Depression screen PHQ 2/9  Decreased Interest 0  Down, Depressed, Hopeless 0  PHQ - 2 Score 0  Altered sleeping 0  Tired, decreased energy 0  Change in appetite 0  Feeling bad or failure about yourself  0  Trouble concentrating 0  Moving slowly or fidgety/restless 0  Suicidal thoughts 0  PHQ-9 Score 0     Current Outpatient Medications  Medication Instructions   aspirin  162 mg, Oral, Daily   Blood Pressure Monitoring (BLOOD PRESSURE KIT) KIT Arm circumference- 14.75inches  Ht: 12ft 8 Weight 279lbs  Address- [address]  [place] [address]   Medicaid #[id number]   famotidine  (PEPCID ) 20 mg, Oral, 2 times daily   Ferrous Fumarate  324 MG TABS 1 tablet, Oral, Every other day   metFORMIN  (GLUCOPHAGE ) 1,000 mg, Oral, 2 times daily with meals   NIFEdipine  (PROCARDIA  XL) 30 mg, Oral, Daily   pantoprazole  (PROTONIX ) 40 mg, Every morning   Vitamin D (Ergocalciferol) (DRISDOL) 50,000 Units, Every 7 days     Review of Systems:   Pertinent items are noted in HPI Denies abnormal vaginal discharge w/ itching/odor/irritation, headaches, visual changes, shortness of breath, chest pain, abdominal pain, severe nausea/vomiting, or problems with urination or bowel movements unless otherwise stated above. Pertinent History  Reviewed:  Reviewed past medical,surgical, social, obstetrical and family history.  Reviewed problem list, medications and allergies. Physical Assessment:   Vitals:   06/26/24 1436  BP: 131/82  Pulse: 98  Weight: 271 lb (122.9 kg)  Body mass index is 41.21 kg/m.           Physical Examination:   General appearance: alert, well appearing, and in no distress  Mental status: normal mood, behavior, speech, dress, motor activity, and thought processes  Skin: warm & dry   Extremities:   no edema, no calf tenderness bilaterally   Cardiovascular: normal heart rate noted  Respiratory: normal respiratory effort, no distress  Abdomen: gravid, soft, non-tender  Pelvic: Cervical exam deferred         Fetal Status: Fetal Heart Rate (bpm): 160        Fetal Surveillance Testing today: doppler   Chaperone: N/A    No results found for this or any previous visit (from the past 24 hours).   Assessment & Plan:  High-risk pregnancy: [id number] at [redacted]w[redacted]d with an Estimated Date of Delivery: 12/31/24   1) T2DM -increase MTF 1000mg  am/500pm, may increase to 2 tabs twice daily if still elevated ([]  Rx sent in for 1000mg  BID as suspect she will likely need further changes)  2) Chronic HTN -stable on current meds  3) h/o C-section 4) GERD Adding pepcid  twice daily   Meds:  Meds ordered this encounter  Medications   famotidine  (PEPCID ) 20 MG tablet    Sig: Take  1 tablet (20 mg total) by mouth 2 (two) times daily.    Dispense:  60 tablet    Refill:  11   metFORMIN  (GLUCOPHAGE ) 500 MG tablet    Sig: Take 2 tablets (1,000 mg total) by mouth 2 (two) times daily with a meal.    Dispense:  120 tablet    Refill:  11    Labs/procedures today: doppler  Treatment Plan:  routine OB care and as outlined above  Reviewed: Preterm labor symptoms and general obstetric precautions including but not limited to vaginal bleeding, contractions, leaking of fluid and fetal movement were reviewed in detail with  the patient.  All questions were answered. Pt has home bp cuff. Check bp weekly, let us  know if >140/90.   Follow-up: Return for as scheduled 8/14.   Future Appointments  Date Time Provider Department Center  07/11/2024  3:10 PM Jayne Vonn DEL, MD [facility]  08/09/2024  9:15 AM [facility] US  [facility] None  08/09/2024 10:10 AM Jayden Kratochvil, DO [facility]  08/09/2024  2:40 PM Tobb, Kardie, DO [facility] None    No orders of the defined types were placed in this encounter.   Aaro Meyers, DO Attending Obstetrician & Gynecologist, [facility] for [employer], [facility]

## 2024-06-27 ENCOUNTER — Encounter: Payer: Self-pay | Admitting: Obstetrics & Gynecology

## 2024-07-11 ENCOUNTER — Encounter: Payer: Self-pay | Admitting: Obstetrics & Gynecology

## 2024-07-11 ENCOUNTER — Ambulatory Visit (INDEPENDENT_AMBULATORY_CARE_PROVIDER_SITE_OTHER): Admitting: Obstetrics & Gynecology

## 2024-07-11 VITALS — BP 131/87 | HR 98 | Wt 276.0 lb

## 2024-07-11 DIAGNOSIS — Z98891 History of uterine scar from previous surgery: Secondary | ICD-10-CM

## 2024-07-11 DIAGNOSIS — O0992 Supervision of high risk pregnancy, unspecified, second trimester: Secondary | ICD-10-CM | POA: Diagnosis not present

## 2024-07-11 DIAGNOSIS — Z1379 Encounter for other screening for genetic and chromosomal anomalies: Secondary | ICD-10-CM | POA: Diagnosis not present

## 2024-07-11 DIAGNOSIS — O24312 Unspecified pre-existing diabetes mellitus in pregnancy, second trimester: Secondary | ICD-10-CM

## 2024-07-11 DIAGNOSIS — Z3A15 15 weeks gestation of pregnancy: Secondary | ICD-10-CM | POA: Diagnosis not present

## 2024-07-11 DIAGNOSIS — O0990 Supervision of high risk pregnancy, unspecified, unspecified trimester: Secondary | ICD-10-CM

## 2024-07-11 DIAGNOSIS — O24319 Unspecified pre-existing diabetes mellitus in pregnancy, unspecified trimester: Secondary | ICD-10-CM

## 2024-07-11 NOTE — Progress Notes (Signed)
 HIGH-RISK PREGNANCY VISIT Patient name: Candice Campos MRN [id number]  Date of birth: July 30, 1991 Chief Complaint:   Routine Prenatal Visit  History of Present Illness:   Candice Campos is a 33 y.o. H6E9888 female at [redacted]w[redacted]d with an Estimated Date of Delivery: 12/31/24 being seen today for ongoing management of a high-risk pregnancy complicated by     ICD-10-CM   1. Supervision of high risk pregnancy, antepartum  O09.90     2. Genetic screening  Z13.79 AFP, Serum, Open Spina Bifida    3. [redacted] weeks gestation of pregnancy  Z3A.15 AFP, Serum, Open Spina Bifida    4. Class B- ^MTF 1000mg  am/500 pm  O24.319     5. Previous cesarean section  Z98.891      .    Today she reports no complaints. Contractions: Not present.  .   . denies leaking of fluid.      06/12/2024    9:46 AM  Depression screen PHQ 2/9  Decreased Interest 0  Down, Depressed, Hopeless 0  PHQ - 2 Score 0  Altered sleeping 0  Tired, decreased energy 0  Change in appetite 0  Feeling bad or failure about yourself  0  Trouble concentrating 0  Moving slowly or fidgety/restless 0  Suicidal thoughts 0  PHQ-9 Score 0        06/12/2024    9:46 AM  GAD 7 : Generalized Anxiety Score  Nervous, Anxious, on Edge 0  Control/stop worrying 0  Worry too much - different things 0  Trouble relaxing 0  Restless 0  Easily annoyed or irritable 0  Afraid - awful might happen 0  Total GAD 7 Score 0     Review of Systems:   Pertinent items are noted in HPI Denies abnormal vaginal discharge w/ itching/odor/irritation, headaches, visual changes, shortness of breath, chest pain, abdominal pain, severe nausea/vomiting, or problems with urination or bowel movements unless otherwise stated above. Pertinent History Reviewed:  Reviewed past medical,surgical, social, obstetrical and family history.  Reviewed problem list, medications and allergies. Physical Assessment:   Vitals:   07/11/24 1501  BP: 131/87  Pulse: 98   Weight: 276 lb (125.2 kg)  Body mass index is 41.97 kg/m.           Physical Examination:   General appearance: alert, well appearing, and in no distress  Mental status: alert, oriented to person, place, and time  Skin: warm & dry   Extremities:      Cardiovascular: normal heart rate noted  Respiratory: normal respiratory effort, no distress  Abdomen: gravid, soft, non-tender  Pelvic: Cervical exam deferred         Fetal Status:          Fetal Surveillance Testing today: FHR 160   Chaperone: N/A    No results found for this or any previous visit (from the past 24 hours).  Assessment & Plan:  High-risk pregnancy: H6E9888 at [redacted]w[redacted]d with an Estimated Date of Delivery: 12/31/24      ICD-10-CM   1. Supervision of high risk pregnancy, antepartum  O09.90     2. Genetic screening  Z13.79 AFP, Serum, Open Spina Bifida    3. [redacted] weeks gestation of pregnancy  Z3A.15 AFP, Serum, Open Spina Bifida    4. Class B- ^MTF 1000mg  am/500 pm  O24.319     5. Previous cesarean section  Z98.891          Meds: No orders of the defined types were placed in  this encounter.   Orders:  Orders Placed This Encounter  Procedures   AFP, Serum, Open Spina Bifida     Labs/procedures today: none  Treatment Plan:  sonogram scheduled    Follow-up: No follow-ups on file.   Future Appointments  Date Time Provider Department Center  08/09/2024  9:15 AM [facility] - [facility] US  [facility] None  08/09/2024 10:10 AM Marilynn Nest, DO [facility]  08/09/2024  2:40 PM Tobb, Kardie, DO [facility] None    Orders Placed This Encounter  Procedures   AFP, Serum, Open Spina Bifida   Vonn VEAR Inch  Attending Physician for the Center for [facility] 07/11/2024 4:05 PM

## 2024-07-12 ENCOUNTER — Emergency Department (HOSPITAL_COMMUNITY)
Admission: EM | Admit: 2024-07-12 | Discharge: 2024-07-12 | Disposition: A | Discharge status: Home / Self Care | Attending: Emergency Medicine | Admitting: Emergency Medicine

## 2024-07-12 ENCOUNTER — Other Ambulatory Visit: Payer: Self-pay

## 2024-07-12 DIAGNOSIS — Z3A15 15 weeks gestation of pregnancy: Secondary | ICD-10-CM | POA: Insufficient documentation

## 2024-07-12 DIAGNOSIS — Z7982 Long term (current) use of aspirin: Secondary | ICD-10-CM | POA: Diagnosis not present

## 2024-07-12 DIAGNOSIS — O162 Unspecified maternal hypertension, second trimester: Secondary | ICD-10-CM | POA: Diagnosis present

## 2024-07-12 NOTE — Discharge Instructions (Signed)
 Follow your blood pressure at home but try not to worry about the levels.  Follow-up with obstetricians.

## 2024-07-12 NOTE — ED Triage Notes (Signed)
 Pt c/o hypertension states she is [redacted] weeks pregnant. BP 166/94 at home. Reports having preeclampsia with prior pregnancy. No other complaint at this time.   158/92 in triage

## 2024-07-15 NOTE — ED Provider Notes (Signed)
 [place] EMERGENCY DEPARTMENT AT [facility] Provider Note   CSN: [id number] Arrival date & time: 07/12/24  1951     Patient presents with: Hypertension   Candice Campos is a 33 y.o. female.    Hypertension  Patient presents with high blood pressure.  Around [redacted] weeks pregnant.  Has had previous preeclampsia.  Is currently being treated with blood pressure medicines by her gynecologist.  States they have been told they may have to go up soon.  Will occasionally get dull headaches.  Occasionally dull chest pain.  Feeling overall well today.  States blood pressure was normal earlier today and yesterday.  Does check blood pressure at home.  However sometimes it is variable depending on her cough or what she is checking with.  Patient is also here with her 29-month-old daughter who has the sniffles.     Prior to Admission medications   Medication Sig Start Date End Date Taking? Authorizing Provider  aspirin  81 MG chewable tablet Chew 2 tablets (162 mg total) by mouth daily. Patient not taking: Reported on 07/11/2024 06/12/24   Loreli Suzen BIRCH, CNM  Blood Pressure Monitoring (BLOOD PRESSURE KIT) KIT Arm circumference- 14.75inches  Ht: 76ft 8 Weight 279lbs  Address- [address]  [place] [address]   Medicaid #[id number] 07/07/23   [provider]  famotidine  (PEPCID ) 20 MG tablet Take 1 tablet (20 mg total) by mouth 2 (two) times daily. 06/26/24 07/26/24  Ozan, Jennifer, DO  Ferrous Fumarate  324 MG TABS Take 1 tablet by mouth every other day. Patient not taking: Reported on 07/11/2024 06/16/24   Loreli Suzen BIRCH, CNM  metFORMIN  (GLUCOPHAGE ) 500 MG tablet Take 2 tablets (1,000 mg total) by mouth 2 (two) times daily with a meal. 06/26/24 07/26/24  Ozan, Jennifer, DO  NIFEdipine  (PROCARDIA  XL) 30 MG 24 hr tablet Take 1 tablet (30 mg total) by mouth daily. 06/18/24   Ozan, Jennifer, DO  pantoprazole  (PROTONIX ) 40 MG tablet Take 40 mg by mouth every morning. 02/27/24   [provider]  Vitamin D, Ergocalciferol, (DRISDOL) 1.25 MG (50000 UNIT) CAPS capsule Take 50,000 Units by mouth every 7 (seven) days.    [provider]    Allergies: Amoxicillin and Corticosteroids    Review of Systems  Updated Vital Signs BP (!) 158/92 (BP Location: Right Arm)   Pulse (!) 124   Temp 98.7 F (37.1 C) (Oral)   Resp 18   Ht 5' 6 (1.676 m)   Wt 125.3 kg   LMP  (LMP Unknown)   SpO2 100%   BMI 44.59 kg/m   Physical Exam Constitutional:      Appearance: She is obese.  Cardiovascular:     Rate and Rhythm: Regular rhythm.  Pulmonary:     Breath sounds: No rhonchi.  Musculoskeletal:     Cervical back: Neck supple.  Neurological:     Mental Status: She is alert.     (all labs ordered are listed, but only abnormal results are displayed) Labs Reviewed - No data to display  EKG: None  Radiology: No results found.   Procedures   Medications Ordered in the ED - No data to display                                  Medical Decision Making  Patient with hypertension.  Somewhat elevated here.  However has been recently more normotensive at home.  Still somewhat early for preeclampsia although not completely ruled out.  I think it is more likely both whitecoat hypertension and potentially pregnancy induced hypertension.  Do not think we need extensive workup at this time.  Will follow blood pressures at home and follow-up with her gynecologist.     Final diagnoses:  Hypertension affecting pregnancy in second trimester    ED Discharge Orders     None          Patsey Lot, MD 07/15/24 1341

## 2024-07-23 ENCOUNTER — Encounter (HOSPITAL_COMMUNITY): Payer: Self-pay | Admitting: *Deleted

## 2024-07-23 ENCOUNTER — Emergency Department (HOSPITAL_COMMUNITY)
Admission: EM | Admit: 2024-07-23 | Discharge: 2024-07-23 | Disposition: A | Discharge status: Home / Self Care | Attending: Emergency Medicine | Admitting: Emergency Medicine

## 2024-07-23 ENCOUNTER — Other Ambulatory Visit: Payer: Self-pay

## 2024-07-23 DIAGNOSIS — E860 Dehydration: Secondary | ICD-10-CM | POA: Diagnosis not present

## 2024-07-23 DIAGNOSIS — Z7982 Long term (current) use of aspirin: Secondary | ICD-10-CM | POA: Insufficient documentation

## 2024-07-23 DIAGNOSIS — O99282 Endocrine, nutritional and metabolic diseases complicating pregnancy, second trimester: Secondary | ICD-10-CM | POA: Insufficient documentation

## 2024-07-23 LAB — COMPREHENSIVE METABOLIC PANEL WITH GFR
ALT: 14 U/L (ref 0–44)
AST: 14 U/L — ABNORMAL LOW (ref 15–41)
Albumin: 3.2 g/dL — ABNORMAL LOW (ref 3.5–5.0)
Alkaline Phosphatase: 67 U/L (ref 38–126)
Anion gap: 9 (ref 5–15)
BUN: 7 mg/dL (ref 6–20)
CO2: 21 mmol/L — ABNORMAL LOW (ref 22–32)
Calcium: 9.1 mg/dL (ref 8.9–10.3)
Chloride: 107 mmol/L (ref 98–111)
Creatinine, Ser: 0.45 mg/dL (ref 0.44–1.00)
GFR, Estimated: >60 mL/min (ref >60)
Glucose, Bld: 170 mg/dL — ABNORMAL HIGH (ref 70–99)
Potassium: 3.3 mmol/L — ABNORMAL LOW (ref 3.5–5.1)
Sodium: 137 mmol/L (ref 135–145)
Total Bilirubin: 0.6 mg/dL (ref 0.0–1.2)
Total Protein: 7.2 g/dL (ref 6.5–8.1)

## 2024-07-23 LAB — CBC WITH DIFFERENTIAL/PLATELET
Abs Immature Granulocytes: 0.05 K/uL (ref 0.00–0.07)
Basophils Absolute: 0 K/uL (ref 0.0–0.1)
Basophils Relative: 0 %
Eosinophils Absolute: 0.1 K/uL (ref 0.0–0.5)
Eosinophils Relative: 1 %
HCT: 34.3 % — ABNORMAL LOW (ref 36.0–46.0)
Hemoglobin: 10.9 g/dL — ABNORMAL LOW (ref 12.0–15.0)
Immature Granulocytes: 1 %
Lymphocytes Relative: 18 %
Lymphs Abs: 1.4 K/uL (ref 0.7–4.0)
MCH: 24.3 pg — ABNORMAL LOW (ref 26.0–34.0)
MCHC: 31.8 g/dL (ref 30.0–36.0)
MCV: 76.6 fL — ABNORMAL LOW (ref 80.0–100.0)
Monocytes Absolute: 0.5 K/uL (ref 0.1–1.0)
Monocytes Relative: 7 %
Neutro Abs: 5.8 K/uL (ref 1.7–7.7)
Neutrophils Relative %: 73 %
Platelets: 287 K/uL (ref 150–400)
RBC: 4.48 MIL/uL (ref 3.87–5.11)
RDW: 18.1 % — ABNORMAL HIGH (ref 11.5–15.5)
WBC: 7.8 K/uL (ref 4.0–10.5)
nRBC: 0 % (ref 0.0–0.2)

## 2024-07-23 LAB — D-DIMER, QUANTITATIVE: D-Dimer, Quant: 0.36 ug{FEU}/mL (ref 0.00–0.50)

## 2024-07-23 MED ORDER — SODIUM CHLORIDE 0.9 % IV BOLUS
1000.0000 mL | Freq: Once | INTRAVENOUS | Status: AC
  Administered 2024-07-23: 1000 mL via INTRAVENOUS

## 2024-07-23 NOTE — ED Triage Notes (Signed)
 Pt noted elevated HR in 140's at home. Mid CP,  pt states it may be acid reflux. Pt states she is [redacted] weeks pregnant.

## 2024-07-23 NOTE — Discharge Instructions (Signed)
 Drink plenty of fluids and follow-up with your OB/GYN doctor as planned.  Return if problem

## 2024-07-24 NOTE — ED Provider Notes (Signed)
 [place] EMERGENCY DEPARTMENT AT [facility] Provider Note   CSN: [id number] Arrival date & time: 07/23/24  1845     Patient presents with: Tachycardia ([redacted] wks pregnant )   Candice Campos is a 33 y.o. female.   Patient is [redacted] weeks pregnant and she states that she noticed her heart beating fast.  This happened several times with her previous pregnancy and she was evaluated and found no heart problems.   Palpitations Palpitations quality:  Regular Onset quality:  Sudden Timing:  Constant Progression:  Waxing and waning Chronicity:  Recurrent Context: not anxiety   Relieved by:  Nothing Worsened by:  Nothing Ineffective treatments:  None tried Associated symptoms: no back pain, no chest pain and no cough        Prior to Admission medications   Medication Sig Start Date End Date Taking? Authorizing Provider  aspirin  81 MG chewable tablet Chew 2 tablets (162 mg total) by mouth daily. Patient not taking: Reported on 07/11/2024 06/12/24   Loreli Suzen BIRCH, CNM  Blood Pressure Monitoring (BLOOD PRESSURE KIT) KIT Arm circumference- 14.75inches  Ht: 46ft 8 Weight 279lbs  Address- [address]  [place] [address]   Medicaid #[id number] 07/07/23   [provider]  Cholecalciferol 125 MCG (5000 UT) TABS Take 1 tablet by mouth daily. 02/15/24   [provider]  famotidine  (PEPCID ) 20 MG tablet Take 1 tablet (20 mg total) by mouth 2 (two) times daily. 06/26/24 07/26/24  Ozan, Jennifer, DO  Ferrous Fumarate  324 MG TABS Take 1 tablet by mouth every other day. Patient not taking: Reported on 07/11/2024 06/16/24   Loreli Suzen BIRCH, CNM  folic acid (FOLVITE) 1 MG tablet Take 1 mg by mouth daily.    [provider]  metFORMIN  (GLUCOPHAGE ) 500 MG tablet Take 2 tablets (1,000 mg total) by mouth 2 (two) times daily with a meal. 06/26/24 07/26/24  Ozan, Jennifer, DO  NIFEdipine  (PROCARDIA  XL) 30 MG 24 hr tablet Take 1 tablet (30 mg total) by mouth daily. 06/18/24    Ozan, Jennifer, DO  pantoprazole  (PROTONIX ) 40 MG tablet Take 40 mg by mouth every morning. 02/27/24   [provider]  Vitamin D, Ergocalciferol, (DRISDOL) 1.25 MG (50000 UNIT) CAPS capsule Take 50,000 Units by mouth every 7 (seven) days.    [provider]    Allergies: Corticosteroids, Sertraline, and Amoxicillin    Review of Systems  Constitutional:  Negative for appetite change and fatigue.  HENT:  Negative for congestion, ear discharge and sinus pressure.   Eyes:  Negative for discharge.  Respiratory:  Negative for cough.   Cardiovascular:  Positive for palpitations. Negative for chest pain.  Gastrointestinal:  Negative for abdominal pain and diarrhea.  Genitourinary:  Negative for frequency and hematuria.  Musculoskeletal:  Negative for back pain.  Skin:  Negative for rash.  Neurological:  Negative for seizures and headaches.  Psychiatric/Behavioral:  Negative for hallucinations.     Updated Vital Signs BP 133/81 (BP Location: Left Arm)   Pulse 98   Temp 98.1 F (36.7 C) (Oral)   Resp 15   Ht 5' 6 (1.676 m)   Wt 122.9 kg   LMP  (LMP Unknown)   SpO2 100%   BMI 43.74 kg/m   Physical Exam Vitals and nursing note reviewed.  Constitutional:      Appearance: She is well-developed.  HENT:     Head: Normocephalic.     Nose: Nose normal.  Eyes:     General:  No scleral icterus.    Conjunctiva/sclera: Conjunctivae normal.  Neck:     Thyroid: No thyromegaly.  Cardiovascular:     Rate and Rhythm: Regular rhythm. Tachycardia present.     Heart sounds: No murmur heard.    No friction rub. No gallop.  Pulmonary:     Breath sounds: No stridor. No wheezing or rales.  Chest:     Chest wall: No tenderness.  Abdominal:     General: There is no distension.     Tenderness: There is no abdominal tenderness. There is no rebound.  Musculoskeletal:        General: Normal range of motion.     Cervical back: Neck supple.  Lymphadenopathy:     Cervical: No  cervical adenopathy.  Skin:    Findings: No erythema or rash.  Neurological:     Mental Status: She is alert and oriented to person, place, and time.     Motor: No abnormal muscle tone.     Coordination: Coordination normal.  Psychiatric:        Behavior: Behavior normal.     (all labs ordered are listed, but only abnormal results are displayed) Labs Reviewed  CBC WITH DIFFERENTIAL/PLATELET - Abnormal; Notable for the following components:      Result Value   Hemoglobin 10.9 (*)    HCT 34.3 (*)    MCV 76.6 (*)    MCH 24.3 (*)    RDW 18.1 (*)    All other components within normal limits  COMPREHENSIVE METABOLIC PANEL WITH GFR - Abnormal; Notable for the following components:   Potassium 3.3 (*)    CO2 21 (*)    Glucose, Bld 170 (*)    Albumin 3.2 (*)    AST 14 (*)    All other components within normal limits  D-DIMER, QUANTITATIVE    EKG: None  Radiology: No results found.   Procedures   Medications Ordered in the ED  sodium chloride  0.9 % bolus 1,000 mL (0 mLs Intravenous Stopped 07/23/24 2102)   Patient with tachycardia that resolved with IV fluids.  Suspect dehydration.  She will follow up with her OB/GYN                                 Medical Decision Making Amount and/or Complexity of Data Reviewed Labs: ordered.   Tachycardia secondary to dehydration.  She has resolved her tachycardia with IV fluids     Final diagnoses:  Dehydration    ED Discharge Orders     None          Suzette Pac, MD 07/24/24 1048

## 2024-07-25 ENCOUNTER — Encounter: Payer: Self-pay | Admitting: Obstetrics & Gynecology

## 2024-07-26 ENCOUNTER — Ambulatory Visit: Admitting: Obstetrics & Gynecology

## 2024-07-26 VITALS — BP 132/78 | HR 87 | Wt 276.0 lb

## 2024-07-26 DIAGNOSIS — O10912 Unspecified pre-existing hypertension complicating pregnancy, second trimester: Secondary | ICD-10-CM | POA: Diagnosis not present

## 2024-07-26 DIAGNOSIS — O0992 Supervision of high risk pregnancy, unspecified, second trimester: Secondary | ICD-10-CM | POA: Diagnosis not present

## 2024-07-26 DIAGNOSIS — O24319 Unspecified pre-existing diabetes mellitus in pregnancy, unspecified trimester: Secondary | ICD-10-CM

## 2024-07-26 DIAGNOSIS — O24312 Unspecified pre-existing diabetes mellitus in pregnancy, second trimester: Secondary | ICD-10-CM

## 2024-07-26 DIAGNOSIS — O0990 Supervision of high risk pregnancy, unspecified, unspecified trimester: Secondary | ICD-10-CM

## 2024-07-26 DIAGNOSIS — Z98891 History of uterine scar from previous surgery: Secondary | ICD-10-CM | POA: Diagnosis not present

## 2024-07-26 DIAGNOSIS — Z3A17 17 weeks gestation of pregnancy: Secondary | ICD-10-CM

## 2024-07-26 DIAGNOSIS — O10919 Unspecified pre-existing hypertension complicating pregnancy, unspecified trimester: Secondary | ICD-10-CM

## 2024-07-26 NOTE — Progress Notes (Signed)
 HIGH-RISK PREGNANCY VISIT Patient name: Candice Campos MRN [id number]  Date of birth: 08/20/1991 Chief Complaint:   Routine Prenatal Visit  History of Present Illness:   Candice Campos is a 33 y.o. H6E9888 female at [redacted]w[redacted]d with an Estimated Date of Delivery: 12/31/24 being seen today for ongoing management of a high-risk pregnancy complicated by     ICD-10-CM   1. Supervision of high risk pregnancy, antepartum  O09.90     2. Class B- ^MTF 1000mg  am/500 pm  O24.319     3. Previous cesarean section  Z98.891     4. Chronic hypertension affecting pregnancy  O10.919      .    Today she reports no complaints. Contractions: Not present. Vag. Bleeding: None.  Movement: Absent. denies leaking of fluid.      06/12/2024    9:46 AM  Depression screen PHQ 2/9  Decreased Interest 0  Down, Depressed, Hopeless 0  PHQ - 2 Score 0  Altered sleeping 0  Tired, decreased energy 0  Change in appetite 0  Feeling bad or failure about yourself  0  Trouble concentrating 0  Moving slowly or fidgety/restless 0  Suicidal thoughts 0  PHQ-9 Score 0        06/12/2024    9:46 AM  GAD 7 : Generalized Anxiety Score  Nervous, Anxious, on Edge 0  Control/stop worrying 0  Worry too much - different things 0  Trouble relaxing 0  Restless 0  Easily annoyed or irritable 0  Afraid - awful might happen 0  Total GAD 7 Score 0     Review of Systems:   Pertinent items are noted in HPI Denies abnormal vaginal discharge w/ itching/odor/irritation, headaches, visual changes, shortness of breath, chest pain, abdominal pain, severe nausea/vomiting, or problems with urination or bowel movements unless otherwise stated above. Pertinent History Reviewed:  Reviewed past medical,surgical, social, obstetrical and family history.  Reviewed problem list, medications and allergies. Physical Assessment:   Vitals:   07/26/24 0837  BP: 132/78  Pulse: 87  Weight: 276 lb (125.2 kg)  Body mass index is 44.55  kg/m.           Physical Examination:   General appearance: alert, well appearing, and in no distress  Mental status: alert, oriented to person, place, and time, depressed mood  Skin: warm & dry   Extremities:      Cardiovascular: normal heart rate noted  Respiratory: normal respiratory effort, no distress  Abdomen: gravid, soft, non-tender  Pelvic: Cervical exam deferred         Fetal Status:     Movement: Absent    Fetal Surveillance Testing today: FHT 154   Chaperone:     No results found for this or any previous visit (from the past 24 hours).  Assessment & Plan:  High-risk pregnancy: H6E9888 at [redacted]w[redacted]d with an Estimated Date of Delivery: 12/31/24      ICD-10-CM   1. Supervision of high risk pregnancy, antepartum  O09.90     2. Class B- ^MTF 1000mg  am/500 pm  O24.319     3. Previous cesarean section  Z98.891     4. Chronic hypertension affecting pregnancy  O10.919          Meds: No orders of the defined types were placed in this encounter.   Orders: No orders of the defined types were placed in this encounter.    Labs/procedures today: none  Treatment Plan:  keep    Follow-up: Return for  keep scheduled.   Future Appointments  Date Time Provider Department Center  08/06/2024 10:00 AM [facility] - [facility] US  [facility] None  08/06/2024 10:50 AM Ozan, Jennifer, DO [facility]  08/09/2024  2:40 PM Tobb, Kardie, DO [facility] None    No orders of the defined types were placed in this encounter.  Candice Campos  Attending Physician for the Center for [facility] 08/04/2024 10:40 PM

## 2024-07-28 ENCOUNTER — Other Ambulatory Visit: Payer: Self-pay | Admitting: Obstetrics & Gynecology

## 2024-08-06 ENCOUNTER — Ambulatory Visit

## 2024-08-06 ENCOUNTER — Ambulatory Visit: Admitting: Obstetrics & Gynecology

## 2024-08-06 ENCOUNTER — Encounter: Payer: Self-pay | Admitting: Obstetrics & Gynecology

## 2024-08-06 VITALS — BP 120/79 | HR 82 | Wt 272.6 lb

## 2024-08-06 DIAGNOSIS — O99212 Obesity complicating pregnancy, second trimester: Secondary | ICD-10-CM | POA: Diagnosis not present

## 2024-08-06 DIAGNOSIS — Z3A19 19 weeks gestation of pregnancy: Secondary | ICD-10-CM | POA: Diagnosis not present

## 2024-08-06 DIAGNOSIS — O0990 Supervision of high risk pregnancy, unspecified, unspecified trimester: Secondary | ICD-10-CM

## 2024-08-06 DIAGNOSIS — O10012 Pre-existing essential hypertension complicating pregnancy, second trimester: Secondary | ICD-10-CM | POA: Diagnosis not present

## 2024-08-06 DIAGNOSIS — O10919 Unspecified pre-existing hypertension complicating pregnancy, unspecified trimester: Secondary | ICD-10-CM

## 2024-08-06 DIAGNOSIS — Z98891 History of uterine scar from previous surgery: Secondary | ICD-10-CM

## 2024-08-06 DIAGNOSIS — E66812 Obesity, class 2: Secondary | ICD-10-CM

## 2024-08-06 DIAGNOSIS — Z363 Encounter for antenatal screening for malformations: Secondary | ICD-10-CM | POA: Diagnosis not present

## 2024-08-06 DIAGNOSIS — O24319 Unspecified pre-existing diabetes mellitus in pregnancy, unspecified trimester: Secondary | ICD-10-CM

## 2024-08-06 LAB — POCT URINALYSIS DIPSTICK OB
Blood, UA: NEGATIVE
Glucose, UA: NEGATIVE
Leukocytes, UA: NEGATIVE
Nitrite, UA: NEGATIVE

## 2024-08-06 NOTE — Progress Notes (Signed)
 US  19 wks,breech,FHT 159 bpm,anterior placenta gr 0,cx 4.5 cm,normal ovaries,LVIECF,SVP of fluid 5.5 cm,limited view of spine because of fetal position and body habitus,please have pt come back for additional images,EFW 266 g 43%

## 2024-08-06 NOTE — Progress Notes (Signed)
 HIGH-RISK PREGNANCY VISIT Patient name: Candice Campos MRN [id number]  Date of birth: December 21, 1990 Chief Complaint:   Routine Prenatal Visit  History of Present Illness:   Candice Campos is a 33 y.o. H6E9888 female at [redacted]w[redacted]d with an Estimated Date of Delivery: 12/31/24 being seen today for ongoing management of a high-risk pregnancy complicated by:  -Chronic HTN- Pro XL 30mg  daily -Type 2 (class B) DM Patient did not have log, but states that the majority of her sugars are within the appropriate range Currently on metformin  at thousand a.m., 500 mg p.m. -prior C-section desires TOLAC -Anemia -Obesity  Today she reports no complaints.   Contractions: Not present. Vag. Bleeding: None.  Not yet feeling fetal movement . denies leaking of fluid.      06/12/2024    9:46 AM  Depression screen PHQ 2/9  Decreased Interest 0  Down, Depressed, Hopeless 0  PHQ - 2 Score 0  Altered sleeping 0  Tired, decreased energy 0  Change in appetite 0  Feeling bad or failure about yourself  0  Trouble concentrating 0  Moving slowly or fidgety/restless 0  Suicidal thoughts 0  PHQ-9 Score 0     Current Outpatient Medications  Medication Instructions   aspirin  162 mg, Oral, Daily   Blood Pressure Monitoring (BLOOD PRESSURE KIT) KIT Arm circumference- 14.75inches  Ht: 65ft 8 Weight 279lbs  Address- [address]  [place] [address]   Medicaid #[id number]   Cholecalciferol 125 MCG (5000 UT) TABS 1 tablet, Daily   famotidine  (PEPCID ) 20 mg, Oral, 2 times daily   Ferrous Fumarate  324 MG TABS 1 tablet, Oral, Every other day   folic acid (FOLVITE) 1 mg, Daily   labetalol  (NORMODYNE ) 200 mg, Oral, 2 times daily   metFORMIN  (GLUCOPHAGE ) 1,000 mg, Oral, 2 times daily with meals   NIFEdipine  (PROCARDIA  XL) 30 mg, Oral, Daily   pantoprazole  (PROTONIX ) 40 mg, 2 times daily   Vitamin D (Ergocalciferol) (DRISDOL) 50,000 Units, Every 7 days     Review of Systems:   Pertinent items are noted in  HPI Denies abnormal vaginal discharge w/ itching/odor/irritation, headaches, visual changes, shortness of breath, chest pain, abdominal pain, severe nausea/vomiting, or problems with urination or bowel movements unless otherwise stated above. Pertinent History Reviewed:  Reviewed past medical,surgical, social, obstetrical and family history.  Reviewed problem list, medications and allergies. Physical Assessment:   Vitals:   08/06/24 1051  BP: 120/79  Pulse: 82  Weight: 272 lb 9.6 oz (123.7 kg)  Body mass index is 44 kg/m.           Physical Examination:   General appearance: alert, well appearing, and in no distress  Mental status: normal mood, behavior, speech, dress, motor activity, and thought processes  Skin: warm & dry   Extremities:      Cardiovascular: normal heart rate noted  Respiratory: normal respiratory effort, no distress  Abdomen: gravid, soft, non-tender  Pelvic: Cervical exam deferred         Fetal Status:          Fetal Surveillance Testing today: breech,FHT 159 bpm,anterior placenta gr 0,cx 4.5 cm,normal ovaries,LVIECF,SVP of fluid 5.5 cm,limited view of spine because of fetal position and body habitus,please have pt come back for additional images,EFW 266 g 43%    Chaperone: N/A    Results for orders placed or performed in visit on 08/06/24 (from the past 24 hours)  POC Urinalysis Dipstick OB   Collection Time: 08/06/24 10:58 AM  Result Value  Ref Range   Color, UA     Clarity, UA     Glucose, UA Negative Negative   Bilirubin, UA     Ketones, UA moderate    Spec Grav, UA     Blood, UA neg    pH, UA     POC,PROTEIN,UA Small (1+) Negative, Trace, Small (1+), Moderate (2+), Large (3+), 4+   Urobilinogen, UA     Nitrite, UA neg    Leukocytes, UA Negative Negative   Appearance     Odor       Assessment & Plan:  High-risk pregnancy: H6E9888 at [redacted]w[redacted]d with an Estimated Date of Delivery: 12/31/24   1) Class B DM - No change to current medication -  Strongly encourage patient to bring log every visit - A1c <6.5 prior to pregnancy, fetal ECHO not indicated  2) Chronic HTN Continue Procardia   3) Prior C-section Briefly discussed TOL AC, patient strongly desires  4) Obesity  Anatomy scan completed, no abnormalities noted except isolated ECF.  Follow-up anatomy scan in 4 weeks for completion of spine  Meds: No orders of the defined types were placed in this encounter.   Labs/procedures today: Anatomy scan  Treatment Plan: As outlined above  Reviewed: Preterm labor symptoms and general obstetric precautions including but not limited to vaginal bleeding, contractions, leaking of fluid and fetal movement were reviewed in detail with the patient.  All questions were answered. Pt has home bp cuff. Check bp weekly, let us  know if >140/90.   Follow-up: Return in about 4 weeks (around 09/03/2024) for HROB visit and growth every 4 wks.   Future Appointments  Date Time Provider Department Center  08/09/2024  2:40 PM Tobb, Kardie, DO [facility] None  09/11/2024  9:30 AM [facility]G 2 [facility] None    Orders Placed This Encounter  Procedures   US  FETAL BPP WO NON STRESS   US  OB Follow Up   POC Urinalysis Dipstick OB    Delon Prude, DO Attending Obstetrician & Gynecologist, Faculty Practice Center for [employer], [facility]

## 2024-08-07 ENCOUNTER — Other Ambulatory Visit

## 2024-08-09 ENCOUNTER — Encounter (HOSPITAL_COMMUNITY): Payer: Self-pay

## 2024-08-09 ENCOUNTER — Ambulatory Visit: Admitting: Cardiology

## 2024-08-09 ENCOUNTER — Other Ambulatory Visit

## 2024-08-09 ENCOUNTER — Encounter: Admitting: Obstetrics & Gynecology

## 2024-08-09 ENCOUNTER — Emergency Department (HOSPITAL_COMMUNITY)
Admission: EM | Admit: 2024-08-09 | Discharge: 2024-08-09 | Disposition: A | Discharge status: Home / Self Care | Attending: Emergency Medicine | Admitting: Emergency Medicine

## 2024-08-09 ENCOUNTER — Encounter: Payer: Self-pay | Admitting: Obstetrics & Gynecology

## 2024-08-09 DIAGNOSIS — R600 Localized edema: Secondary | ICD-10-CM | POA: Insufficient documentation

## 2024-08-09 DIAGNOSIS — Z7984 Long term (current) use of oral hypoglycemic drugs: Secondary | ICD-10-CM | POA: Diagnosis not present

## 2024-08-09 DIAGNOSIS — Z7982 Long term (current) use of aspirin: Secondary | ICD-10-CM | POA: Diagnosis not present

## 2024-08-09 DIAGNOSIS — I10 Essential (primary) hypertension: Secondary | ICD-10-CM | POA: Diagnosis not present

## 2024-08-09 DIAGNOSIS — Z3A19 19 weeks gestation of pregnancy: Secondary | ICD-10-CM | POA: Insufficient documentation

## 2024-08-09 DIAGNOSIS — O26892 Other specified pregnancy related conditions, second trimester: Secondary | ICD-10-CM | POA: Insufficient documentation

## 2024-08-09 DIAGNOSIS — D509 Iron deficiency anemia, unspecified: Secondary | ICD-10-CM | POA: Insufficient documentation

## 2024-08-09 DIAGNOSIS — Z79899 Other long term (current) drug therapy: Secondary | ICD-10-CM | POA: Insufficient documentation

## 2024-08-09 DIAGNOSIS — E876 Hypokalemia: Secondary | ICD-10-CM

## 2024-08-09 DIAGNOSIS — O99891 Other specified diseases and conditions complicating pregnancy: Secondary | ICD-10-CM | POA: Insufficient documentation

## 2024-08-09 LAB — CBC
HCT: 30 % — ABNORMAL LOW (ref 36.0–46.0)
Hemoglobin: 9.1 g/dL — ABNORMAL LOW (ref 12.0–15.0)
MCH: 23.8 pg — ABNORMAL LOW (ref 26.0–34.0)
MCHC: 30.3 g/dL (ref 30.0–36.0)
MCV: 78.3 fL — ABNORMAL LOW (ref 80.0–100.0)
Platelets: 262 K/uL (ref 150–400)
RBC: 3.83 MIL/uL — ABNORMAL LOW (ref 3.87–5.11)
RDW: 17.1 % — ABNORMAL HIGH (ref 11.5–15.5)
WBC: 7.1 K/uL (ref 4.0–10.5)
nRBC: 0 % (ref 0.0–0.2)

## 2024-08-09 LAB — AFP, SERUM, OPEN SPINA BIFIDA
AFP MoM: 0.8
AFP Value: 27.4 ng/mL
Gest. Age on Collection Date: 18.9 wk
Maternal Age At EDD: 32.6 a
OSBR Risk 1 IN: 10000
Test Results:: NEGATIVE
Weight: 276 [lb_av]

## 2024-08-09 LAB — COMPREHENSIVE METABOLIC PANEL WITH GFR
ALT: 13 U/L (ref 0–44)
AST: 12 U/L — ABNORMAL LOW (ref 15–41)
Albumin: 2.9 g/dL — ABNORMAL LOW (ref 3.5–5.0)
Alkaline Phosphatase: 64 U/L (ref 38–126)
Anion gap: 11 (ref 5–15)
BUN: 9 mg/dL (ref 6–20)
CO2: 21 mmol/L — ABNORMAL LOW (ref 22–32)
Calcium: 8.7 mg/dL — ABNORMAL LOW (ref 8.9–10.3)
Chloride: 104 mmol/L (ref 98–111)
Creatinine, Ser: 0.7 mg/dL (ref 0.44–1.00)
GFR, Estimated: >60 mL/min (ref >60)
Glucose, Bld: 93 mg/dL (ref 70–99)
Potassium: 3.3 mmol/L — ABNORMAL LOW (ref 3.5–5.1)
Sodium: 136 mmol/L (ref 135–145)
Total Bilirubin: 0.6 mg/dL (ref 0.0–1.2)
Total Protein: 6.7 g/dL (ref 6.5–8.1)

## 2024-08-09 MED ORDER — POTASSIUM CHLORIDE CRYS ER 20 MEQ PO TBCR
40.0000 meq | EXTENDED_RELEASE_TABLET | Freq: Once | ORAL | Status: AC
  Administered 2024-08-09: 40 meq via ORAL
  Filled 2024-08-09: qty 2

## 2024-08-09 NOTE — ED Provider Notes (Signed)
 [place] EMERGENCY DEPARTMENT AT [facility] Provider Note   CSN: [id number] Arrival date & time: 08/09/24  1728     Patient presents with: Hypertension   Candice Campos is a 33 y.o. female who is a currently [redacted] weeks pregnant [facility], presenting for evaluation of hypertension.  She was in the class today when she felt off, flustered for few minutes.  She took her blood pressure and it was 154/91.  She does state that this blood pressure is not too far off from her baseline blood pressure.  She denies headache, chest pain, shortness of breath, she has noticed a little bilateral swelling in her legs and has been on her feet more today than normal.  She takes nifedipine  for blood pressure and has been compliant with this medication.   The history is provided by the patient.       Prior to Admission medications   Medication Sig Start Date End Date Taking? Authorizing Provider  NIFEdipine  (PROCARDIA  XL) 30 MG 24 hr tablet Take 1 tablet (30 mg total) by mouth daily. 06/18/24  Yes Ozan, Jennifer, DO  aspirin  81 MG chewable tablet Chew 2 tablets (162 mg total) by mouth daily. 06/12/24   Loreli Suzen BIRCH, CNM  Blood Pressure Monitoring (BLOOD PRESSURE KIT) KIT Arm circumference- 14.75inches  Ht: 52ft 8 Weight 279lbs  Address- [address]  [place] [address]   Medicaid #[id number] 07/07/23   [provider]  Cholecalciferol 125 MCG (5000 UT) TABS Take 1 tablet by mouth daily. 02/15/24   [provider]  famotidine  (PEPCID ) 20 MG tablet Take 1 tablet (20 mg total) by mouth 2 (two) times daily. 06/26/24 08/06/24  Ozan, Jennifer, DO  Ferrous Fumarate  324 MG TABS Take 1 tablet by mouth every other day. Patient not taking: Reported on 08/06/2024 06/16/24   Loreli Suzen BIRCH, CNM  folic acid (FOLVITE) 1 MG tablet Take 1 mg by mouth daily. Patient not taking: Reported on 08/06/2024    [provider]  labetalol  (NORMODYNE ) 200 MG tablet TAKE 1  TABLET BY MOUTH TWICE A DAY Patient not taking: No sig reported 07/28/24   Jayne Vonn DEL, MD  metFORMIN  (GLUCOPHAGE ) 500 MG tablet Take 2 tablets (1,000 mg total) by mouth 2 (two) times daily with a meal. 06/26/24 08/06/24  Ozan, Jennifer, DO  pantoprazole  (PROTONIX ) 40 MG tablet Take 40 mg by mouth 2 (two) times daily. 02/27/24   [provider]  Vitamin D, Ergocalciferol, (DRISDOL) 1.25 MG (50000 UNIT) CAPS capsule Take 50,000 Units by mouth every 7 (seven) days.    [provider]    Allergies: Corticosteroids, Sertraline, and Amoxicillin    Review of Systems  Constitutional:  Negative for fever.  HENT:  Negative for congestion.   Eyes: Negative.   Respiratory:  Negative for chest tightness and shortness of breath.   Cardiovascular:  Negative for chest pain.  Gastrointestinal:  Negative for abdominal pain and nausea.  Genitourinary: Negative.   Musculoskeletal:  Negative for arthralgias, joint swelling and neck pain.  Skin: Negative.  Negative for rash and wound.  Neurological:  Negative for dizziness, weakness, light-headedness, numbness and headaches.  Psychiatric/Behavioral: Negative.      Updated Vital Signs BP (!) 145/89 (BP Location: Right Arm)   Pulse 99   Temp 98.9 F (37.2 C) (Oral)   Resp 18   Ht 5' 6 (1.676 m)   Wt 122.9 kg   LMP  (LMP Unknown)   SpO2  100%   BMI 43.74 kg/m   Physical Exam Vitals and nursing note reviewed.  Constitutional:      Appearance: She is well-developed.  HENT:     Head: Normocephalic and atraumatic.  Eyes:     Conjunctiva/sclera: Conjunctivae normal.  Cardiovascular:     Rate and Rhythm: Normal rate and regular rhythm.     Heart sounds: Normal heart sounds.  Pulmonary:     Effort: Pulmonary effort is normal.     Breath sounds: Normal breath sounds. No wheezing.  Abdominal:     General: Bowel sounds are normal.     Palpations: Abdomen is soft.     Tenderness: There is no abdominal tenderness.  Musculoskeletal:         General: Normal range of motion.     Cervical back: Normal range of motion.     Right lower leg: Edema present.     Left lower leg: Edema present.     Comments: Trace bilateral ankle edema  Skin:    General: Skin is warm and dry.  Neurological:     Mental Status: She is alert.     (all labs ordered are listed, but only abnormal results are displayed) Labs Reviewed  COMPREHENSIVE METABOLIC PANEL WITH GFR - Abnormal; Notable for the following components:      Result Value   Potassium 3.3 (*)    CO2 21 (*)    Calcium 8.7 (*)    Albumin 2.9 (*)    AST 12 (*)    All other components within normal limits  CBC - Abnormal; Notable for the following components:   RBC 3.83 (*)    Hemoglobin 9.1 (*)    HCT 30.0 (*)    MCV 78.3 (*)    MCH 23.8 (*)    RDW 17.1 (*)    All other components within normal limits    EKG: None  Radiology: No results found.   Procedures   Medications Ordered in the ED  potassium chloride  SA (KLOR-CON  M) CR tablet 40 mEq (has no administration in time range)                                    Medical Decision Making Patient is currently [redacted] weeks pregnant, here due to elevated blood pressure while at work today.  She is compliant with her nifedipine , she endorses being on her feet a lot today with her job duties, also probably had more salt consumption than she should have, ate [place] for lunch.  Labs this evening are reassuring and she is symptom-free during her ED stay here.  She has no pelvic pain.  Denies chest pain, shortness of breath, headache.  She is advised [facility].  Amount and/or Complexity of Data Reviewed Labs: ordered.    Details: Mild hypokalemia with a potassium of 3.3, she has normal LFTs, normal platelet count, she is anemic with a hemoglobin of 9.1, patient concurs this is stable, has been on iron in the past, has been watching this closely with OB.  Risk Prescription drug management.         Final diagnoses:  Primary hypertension  Hypokalemia  Iron deficiency anemia, unspecified iron deficiency anemia type    ED Discharge Orders     None          Reba Hulett, PA-C 08/09/24 2031    Suzette Pac, MD 08/10/24 1133

## 2024-08-09 NOTE — ED Triage Notes (Addendum)
 Pt comes in for hypertension. Pt has been dx with HTN and is on medications. Pt was at a class and felt flustered as in felt off for a few minutes. Pt had her BP taken right after even and it was 154/91. Pt is A&Ox4. Ambulatory in triage. Pt does notice a little swelling bilaterally in the legs.   Pt is prego 19wks and 3 days.    Pt does admit she was up and moving more today than normal.   Pt's BP is w/ normal limits of her regular BP in triage.

## 2024-08-09 NOTE — Discharge Instructions (Signed)
 Your lab tests and exam today are reassuring, you do have a mild hypokalemia ( low potassium level) which has been replaced with a tablet given.  This is not the reason for your elevated blood pressure.  Make sure you are taking your blood pressure medication and following up with [facility] as you have arranged.

## 2024-08-13 ENCOUNTER — Other Ambulatory Visit: Payer: Self-pay | Admitting: *Deleted

## 2024-08-13 MED ORDER — EASY MAX BLOOD GLUCOSE TEST VI STRP: ORAL_STRIP | 12 refills | Status: AC

## 2024-08-13 MED ORDER — TRUEPLUS LANCETS 33G MISC: 1.0000 | Freq: Four times a day (QID) | 12 refills | Status: DC

## 2024-08-29 ENCOUNTER — Encounter: Payer: Self-pay | Admitting: Obstetrics & Gynecology

## 2024-08-30 ENCOUNTER — Encounter: Payer: Self-pay | Admitting: Obstetrics & Gynecology

## 2024-09-03 ENCOUNTER — Encounter: Payer: Self-pay | Admitting: Obstetrics & Gynecology

## 2024-09-11 ENCOUNTER — Encounter: Payer: Self-pay | Admitting: Women's Health

## 2024-09-11 ENCOUNTER — Ambulatory Visit (INDEPENDENT_AMBULATORY_CARE_PROVIDER_SITE_OTHER): Admitting: Women's Health

## 2024-09-11 ENCOUNTER — Ambulatory Visit (INDEPENDENT_AMBULATORY_CARE_PROVIDER_SITE_OTHER): Admitting: Radiology

## 2024-09-11 VITALS — BP 113/71 | HR 76 | Wt 269.0 lb

## 2024-09-11 DIAGNOSIS — O24414 Gestational diabetes mellitus in pregnancy, insulin controlled: Secondary | ICD-10-CM | POA: Diagnosis not present

## 2024-09-11 DIAGNOSIS — Z3A24 24 weeks gestation of pregnancy: Secondary | ICD-10-CM

## 2024-09-11 DIAGNOSIS — O99282 Endocrine, nutritional and metabolic diseases complicating pregnancy, second trimester: Secondary | ICD-10-CM | POA: Diagnosis not present

## 2024-09-11 DIAGNOSIS — O10013 Pre-existing essential hypertension complicating pregnancy, third trimester: Secondary | ICD-10-CM

## 2024-09-11 DIAGNOSIS — O0992 Supervision of high risk pregnancy, unspecified, second trimester: Secondary | ICD-10-CM

## 2024-09-11 DIAGNOSIS — O10919 Unspecified pre-existing hypertension complicating pregnancy, unspecified trimester: Secondary | ICD-10-CM

## 2024-09-11 DIAGNOSIS — E282 Polycystic ovarian syndrome: Secondary | ICD-10-CM

## 2024-09-11 DIAGNOSIS — O09292 Supervision of pregnancy with other poor reproductive or obstetric history, second trimester: Secondary | ICD-10-CM

## 2024-09-11 DIAGNOSIS — O0990 Supervision of high risk pregnancy, unspecified, unspecified trimester: Secondary | ICD-10-CM

## 2024-09-11 DIAGNOSIS — Z6841 Body Mass Index (BMI) 40.0 and over, adult: Secondary | ICD-10-CM

## 2024-09-11 DIAGNOSIS — O24912 Unspecified diabetes mellitus in pregnancy, second trimester: Secondary | ICD-10-CM

## 2024-09-11 DIAGNOSIS — O99212 Obesity complicating pregnancy, second trimester: Secondary | ICD-10-CM

## 2024-09-11 DIAGNOSIS — O24319 Unspecified pre-existing diabetes mellitus in pregnancy, unspecified trimester: Secondary | ICD-10-CM

## 2024-09-11 DIAGNOSIS — E669 Obesity, unspecified: Secondary | ICD-10-CM

## 2024-09-11 NOTE — Patient Instructions (Signed)
 Candice Campos, thank you for choosing our office today! We appreciate the opportunity to meet your healthcare needs. You may receive a short survey by mail, e-mail, or through [employer]. If you are happy with your care we would appreciate if you could take just a few minutes to complete the survey questions. We read all of your comments and take your feedback very seriously. Thank you again for choosing our office.  Center for [employer] Team at [facility]  [facility] at [facility] ([address] [place], [place] [address]) Entrance C, located off of [address] Free 24/7 valet parking   CLASSES: Go to [email or web address] to register for classes (childbirth, breastfeeding, waterbirth, infant CPR, daddy bootcamp, etc.)  Call the office [phone]) or go to [facility] if: You begin to have strong, frequent contractions Your water breaks.  Sometimes it is a big gush of fluid, sometimes it is just a trickle that keeps getting your panties wet or running down your legs You have vaginal bleeding.  It is normal to have a small amount of spotting if your cervix was checked.  You don't feel your baby moving like normal.  If you don't, get you something to eat and drink and lay down and focus on feeling your baby move.   If your baby is still not moving like normal, you should call the office or go to [facility].  Call the office [phone]) or go to [facility] hospital for these signs of pre-eclampsia: Severe headache that does not go away with Tylenol  Visual changes- seeing spots, double, blurred vision Pain under your right breast or upper abdomen that does not go away with Tums or heartburn medicine Nausea and/or vomiting Severe swelling in your hands, feet, and face    [place] [facility]ians/Family Doctors [facility]): [address] Luba BROCKS, [phone]           [facility]: [address], [phone]                 [facility] Family Medicine [facility]): [address], [phone]  [facility] Department: [address], [place], [phone]    [facility] Pediatricians/Family Doctors [facility]): [address], [phone] [facility]: [address] [place], [phone] [facility]: [address], [phone]  [facility] Doctors  [facility]): [phone] [facility]: [address] [phone]   [facility] Doctors [facility]: [address] [phone]  [facility] Doctors  [facility]: [phone], [phone]  Home Blood Pressure Monitoring for Patients   Your provider has recommended that you check your blood pressure (BP) at least once a week at home. If you do not have a blood pressure cuff at home, one will be provided for you. Contact your provider if you have not received your monitor within 1 week.   Helpful Tips for Accurate Home Blood Pressure Checks  Don't smoke, exercise, or drink caffeine 30 minutes before checking your BP Use the restroom before checking your BP (a full bladder can raise your pressure) Relax in a comfortable upright chair Feet on the ground Left arm resting comfortably on a flat surface at the level of your heart Legs uncrossed Back supported Sit quietly and don't talk Place the cuff on your bare arm Adjust snuggly, so that only two fingertips can fit between your  skin and the top of the cuff Check 2 readings separated by at least one minute Keep a log of your BP readings For a visual, please reference this diagram: http://ccnc.care/bpdiagram  Provider Name: [facility]     Phone: [phone]  Zone 1: ALL CLEAR  Continue to monitor your symptoms:  BP reading is less than 140 (top number) or less than 90 (bottom number)  No right upper stomach pain No headaches or  seeing spots No feeling nauseated or throwing up No swelling in face and hands  Zone 2: CAUTION Call your doctor's office for any of the following:  BP reading is greater than 140 (top number) or greater than 90 (bottom number)  Stomach pain under your ribs in the middle or right side Headaches or seeing spots Feeling nauseated or throwing up Swelling in face and hands  Zone 3: EMERGENCY  Seek immediate medical care if you have any of the following:  BP reading is greater than160 (top number) or greater than 110 (bottom number) Severe headaches not improving with Tylenol  Serious difficulty catching your breath Any worsening symptoms from Zone 2   Second Trimester of Pregnancy The second trimester is from week 13 through week 28, months 4 through 6. The second trimester is often a time when you feel your best. Your body has also adjusted to being pregnant, and you begin to feel better physically. Usually, morning sickness has lessened or quit completely, you may have more energy, and you may have an increase in appetite. The second trimester is also a time when the fetus is growing rapidly. At the end of the sixth month, the fetus is about 9 inches long and weighs about 1 pounds. You will likely begin to feel the baby move (quickening) between 18 and 20 weeks of the pregnancy. BODY CHANGES Your body goes through many changes during pregnancy. The changes vary from woman to woman.  Your weight will continue to increase. You will notice your lower abdomen bulging out. You may begin to get stretch marks on your hips, abdomen, and breasts. You may develop headaches that can be relieved by medicines approved by your health care provider. You may urinate more often because the fetus is pressing on your bladder. You may develop or continue to have heartburn as a result of your pregnancy. You may develop constipation because certain hormones are causing the muscles that push waste through your  intestines to slow down. You may develop hemorrhoids or swollen, bulging veins (varicose veins). You may have back pain because of the weight gain and pregnancy hormones relaxing your joints between the bones in your pelvis and as a result of a shift in weight and the muscles that support your balance. Your breasts will continue to grow and be tender. Your gums may bleed and may be sensitive to brushing and flossing. Dark spots or blotches (chloasma, mask of pregnancy) may develop on your face. This will likely fade after the baby is born. A dark line from your belly button to the pubic area (linea nigra) may appear. This will likely fade after the baby is born. You may have changes in your hair. These can include thickening of your hair, rapid growth, and changes in texture. Some women also have hair loss during or after pregnancy, or hair that feels dry or thin. Your hair will most likely return to normal after your baby is born. WHAT TO EXPECT AT YOUR PRENATAL VISITS During a routine prenatal visit: You will  be weighed to make sure you and the fetus are growing normally. Your blood pressure will be taken. Your abdomen will be measured to track your baby's growth. The fetal heartbeat will be listened to. Any test results from the previous visit will be discussed. Your health care provider may ask you: How you are feeling. If you are feeling the baby move. If you have had any abnormal symptoms, such as leaking fluid, bleeding, severe headaches, or abdominal cramping. If you have any questions. Other tests that may be performed during your second trimester include: Blood tests that check for: Low iron levels (anemia). Gestational diabetes (between 24 and 28 weeks). Rh antibodies. Urine tests to check for infections, diabetes, or protein in the urine. An ultrasound to confirm the proper growth and development of the baby. An amniocentesis to check for possible genetic problems. Fetal  screens for spina bifida and Down syndrome. HOME CARE INSTRUCTIONS  Avoid all smoking, herbs, alcohol, and unprescribed drugs. These chemicals affect the formation and growth of the baby. Follow your health care provider's instructions regarding medicine use. There are medicines that are either safe or unsafe to take during pregnancy. Exercise only as directed by your health care provider. Experiencing uterine cramps is a good sign to stop exercising. Continue to eat regular, healthy meals. Wear a good support bra for breast tenderness. Do not use hot tubs, steam rooms, or saunas. Wear your seat belt at all times when driving. Avoid raw meat, uncooked cheese, cat litter boxes, and soil used by cats. These carry germs that can cause birth defects in the baby. Take your prenatal vitamins. Try taking a stool softener (if your health care provider approves) if you develop constipation. Eat more high-fiber foods, such as fresh vegetables or fruit and whole grains. Drink plenty of fluids to keep your urine clear or pale yellow. Take warm sitz baths to soothe any pain or discomfort caused by hemorrhoids. Use hemorrhoid cream if your health care provider approves. If you develop varicose veins, wear support hose. Elevate your feet for 15 minutes, 3-4 times a day. Limit salt in your diet. Avoid heavy lifting, wear low heel shoes, and practice good posture. Rest with your legs elevated if you have leg cramps or low back pain. Visit your dentist if you have not gone yet during your pregnancy. Use a soft toothbrush to brush your teeth and be gentle when you floss. A sexual relationship may be continued unless your health care provider directs you otherwise. Continue to go to all your prenatal visits as directed by your health care provider. SEEK MEDICAL CARE IF:  You have dizziness. You have mild pelvic cramps, pelvic pressure, or nagging pain in the abdominal area. You have persistent nausea, vomiting, or  diarrhea. You have a bad smelling vaginal discharge. You have pain with urination. SEEK IMMEDIATE MEDICAL CARE IF:  You have a fever. You are leaking fluid from your vagina. You have spotting or bleeding from your vagina. You have severe abdominal cramping or pain. You have rapid weight gain or loss. You have shortness of breath with chest pain. You notice sudden or extreme swelling of your face, hands, ankles, feet, or legs. You have not felt your baby move in over an hour. You have severe headaches that do not go away with medicine. You have vision changes. Document Released: 11/08/2001 Document Revised: 11/19/2013 Document Reviewed: 01/15/2013 [facility] Patient Information 2015 [place], [place]. This information is not intended to replace advice given to you by your  health care provider. Make sure you discuss any questions you have with your health care provider.

## 2024-09-11 NOTE — Progress Notes (Signed)
 HIGH-RISK PREGNANCY VISIT Patient name: Candice Campos MRN [id number]  Date of birth: Jan 30, 1991 Chief Complaint:   Routine Prenatal Visit  History of Present Illness:   Candice Campos is a 33 y.o. [id number] female at [redacted]w[redacted]d with an Estimated Date of Delivery: 12/31/24 being seen today for ongoing management of a high-risk pregnancy complicated by chronic hypertension currently on nifedipine  30mg  daily, diabetes mellitus T2DM: Class B, currently on metformin  1000/500, and PG BMI 45.    Today she reports didn't bring log, reports only 3 fastings ^ since last visit (highest 97), no 2hr pp ^. Did feel shaky one day when sat down to eat lunch, sugar was 67, ate M&Ms, KitKat, and fried rice- recheck was 169. Contractions: Not present. Vag. Bleeding: None.  Movement: Present. denies leaking of fluid.      06/12/2024    9:46 AM  Depression screen PHQ 2/9  Decreased Interest 0  Down, Depressed, Hopeless 0  PHQ - 2 Score 0  Altered sleeping 0  Tired, decreased energy 0  Change in appetite 0  Feeling bad or failure about yourself  0  Trouble concentrating 0  Moving slowly or fidgety/restless 0  Suicidal thoughts 0  PHQ-9 Score 0        06/12/2024    9:46 AM  GAD 7 : Generalized Anxiety Score  Nervous, Anxious, on Edge 0  Control/stop worrying 0  Worry too much - different things 0  Trouble relaxing 0  Restless 0  Easily annoyed or irritable 0  Afraid - awful might happen 0  Total GAD 7 Score 0     Review of Systems:   Pertinent items are noted in HPI Denies abnormal vaginal discharge w/ itching/odor/irritation, headaches, visual changes, shortness of breath, chest pain, abdominal pain, severe nausea/vomiting, or problems with urination or bowel movements unless otherwise stated above. Pertinent History Reviewed:  Reviewed past medical,surgical, social, obstetrical and family history.  Reviewed problem list, medications and allergies. Physical Assessment:   Vitals:   09/11/24  1005  BP: 113/71  Pulse: 76  Weight: 269 lb (122 kg)  Body mass index is 43.42 kg/m.           Physical Examination:   General appearance: alert, well appearing, and in no distress  Mental status: alert, oriented to person, place, and time  Skin: warm & dry   Extremities:      Cardiovascular: normal heart rate noted  Respiratory: normal respiratory effort, no distress  Abdomen: gravid, soft, non-tender  Pelvic: Cervical exam deferred         Fetal Status:     Movement: Present    Fetal Surveillance Testing today: GA 24+1 wks Single active female fetus, frank breech, FHR = 148 bpm, SVP = 5.6 cm, nl amn fluid vol, anterior pl, gr1, EFW 657g, 37%, nl ov's, CL = 5.2 cm, closed,  normal spine, no evidence of LV EIF, nl cardiac views, anatomy screen complete    Chaperone: N/A  No results found for this or any previous visit (from the past 24 hours).  Assessment & Plan:  High-risk pregnancy: [id number] at [redacted]w[redacted]d with an Estimated Date of Delivery: 12/31/24   1) CHTN, stable on nifedipine  30mg  daily, ASA  2) T2DM, stable on metformin  1000/500, EFW today 37% w/ normal AFI, advised to bring log to each appt  3) PGBMI 45> currently 43  4) Prev C/S> wants TOLAC  5) H/O severe pre-e w/ 34w delivery  Meds: No orders of the defined types were  placed in this encounter.   Labs/procedures today: U/S  Treatment Plan:  EFW q 4w    2x/wk testing nst/sono @ 32wks     Deliver 37-39.0wks (or as per MFM w/ poor control)____   Reviewed: Preterm labor symptoms and general obstetric precautions including but not limited to vaginal bleeding, contractions, leaking of fluid and fetal movement were reviewed in detail with the patient.  All questions were answered. Does have home bp cuff. Office bp cuff given: not applicable. Check bp weekly, let us  know if consistently >140 and/or >90.  Follow-up: Return in about 4 weeks (around 10/09/2024) for HROB, PN2 (no GTT), US :EFW, MD.   Future Appointments  Date  Time Provider Department Center  10/09/2024  8:30 AM [facility]  10/09/2024  9:15 AM [facility] IMG 2 [facility] None  10/09/2024 10:10 AM Kizzie Suzen SAUNDERS, CNM [facility]    No orders of the defined types were placed in this encounter.  Suzen SAUNDERS Kizzie CNM, [facility] 09/11/2024 10:35 AM

## 2024-09-11 NOTE — Progress Notes (Signed)
 GA 24+1 wks Single active female fetus, frank breech, FHR = 148 bpm, SVP = 5.6 cm, nl amn fluid vol, anterior pl, gr1, EFW 657g, 37%, nl ov's, CL = 5.2 cm, closed,  normal spine, no evidence of LV EIF, nl cardiac views

## 2024-09-13 ENCOUNTER — Encounter: Payer: Self-pay | Admitting: Women's Health

## 2024-09-13 ENCOUNTER — Encounter: Payer: Self-pay | Admitting: Obstetrics & Gynecology

## 2024-09-24 ENCOUNTER — Encounter: Payer: Self-pay | Admitting: *Deleted

## 2024-09-24 ENCOUNTER — Other Ambulatory Visit: Payer: Self-pay | Admitting: *Deleted

## 2024-09-24 MED ORDER — PANTOPRAZOLE SODIUM 40 MG PO TBEC: DELAYED_RELEASE_TABLET | ORAL | 6 refills | Status: AC

## 2024-10-09 ENCOUNTER — Encounter: Admitting: Women's Health

## 2024-10-09 ENCOUNTER — Other Ambulatory Visit: Admitting: Radiology

## 2024-10-09 ENCOUNTER — Other Ambulatory Visit

## 2024-10-10 ENCOUNTER — Other Ambulatory Visit

## 2024-10-10 ENCOUNTER — Ambulatory Visit (INDEPENDENT_AMBULATORY_CARE_PROVIDER_SITE_OTHER): Admitting: Obstetrics & Gynecology

## 2024-10-10 ENCOUNTER — Ambulatory Visit

## 2024-10-10 VITALS — BP 126/79 | HR 74 | Wt 273.0 lb

## 2024-10-10 DIAGNOSIS — O99283 Endocrine, nutritional and metabolic diseases complicating pregnancy, third trimester: Secondary | ICD-10-CM

## 2024-10-10 DIAGNOSIS — O0990 Supervision of high risk pregnancy, unspecified, unspecified trimester: Secondary | ICD-10-CM

## 2024-10-10 DIAGNOSIS — O24913 Unspecified diabetes mellitus in pregnancy, third trimester: Secondary | ICD-10-CM | POA: Diagnosis not present

## 2024-10-10 DIAGNOSIS — Z3A28 28 weeks gestation of pregnancy: Secondary | ICD-10-CM

## 2024-10-10 DIAGNOSIS — Z6841 Body Mass Index (BMI) 40.0 and over, adult: Secondary | ICD-10-CM

## 2024-10-10 DIAGNOSIS — E282 Polycystic ovarian syndrome: Secondary | ICD-10-CM | POA: Diagnosis not present

## 2024-10-10 DIAGNOSIS — O10919 Unspecified pre-existing hypertension complicating pregnancy, unspecified trimester: Secondary | ICD-10-CM

## 2024-10-10 DIAGNOSIS — O24113 Pre-existing diabetes mellitus, type 2, in pregnancy, third trimester: Secondary | ICD-10-CM

## 2024-10-10 DIAGNOSIS — O0992 Supervision of high risk pregnancy, unspecified, second trimester: Secondary | ICD-10-CM

## 2024-10-10 DIAGNOSIS — O10013 Pre-existing essential hypertension complicating pregnancy, third trimester: Secondary | ICD-10-CM

## 2024-10-10 DIAGNOSIS — Z98891 History of uterine scar from previous surgery: Secondary | ICD-10-CM

## 2024-10-10 DIAGNOSIS — O10913 Unspecified pre-existing hypertension complicating pregnancy, third trimester: Secondary | ICD-10-CM | POA: Diagnosis not present

## 2024-10-10 DIAGNOSIS — O24319 Unspecified pre-existing diabetes mellitus in pregnancy, unspecified trimester: Secondary | ICD-10-CM

## 2024-10-10 DIAGNOSIS — O0993 Supervision of high risk pregnancy, unspecified, third trimester: Secondary | ICD-10-CM | POA: Diagnosis not present

## 2024-10-10 NOTE — Progress Notes (Signed)
 HIGH-RISK PREGNANCY VISIT Patient name: Candice Campos MRN [id number]  Date of birth: 26-Jul-1991 Chief Complaint:   Routine Prenatal Visit  History of Present Illness:   Candice Campos is a 33 y.o. [id number] female at [redacted]w[redacted]d with an Estimated Date of Delivery: 12/31/24 being seen today for ongoing management of a high-risk pregnancy complicated by:  1) Class B DM on MTF 1000mg  am/ 500mg  pm Sugars reviewed-difficulty keeping log due to work hours.  Based on sugars that she has collected and overall within normal range  2) Chronic HTN-doing well with current medication Notes occasional headache- will ease off when she wears her hair down and notes headaches prior to pregnancy. 3) Prior C-section desires TOLAC 4) obesity 5) anemia  Today she reports no complaints.   Contractions: Not present. Vag. Bleeding: None.  Movement: Present. denies leaking of fluid.      06/12/2024    9:46 AM  Depression screen PHQ 2/9  Decreased Interest 0  Down, Depressed, Hopeless 0  PHQ - 2 Score 0  Altered sleeping 0  Tired, decreased energy 0  Change in appetite 0  Feeling bad or failure about yourself  0  Trouble concentrating 0  Moving slowly or fidgety/restless 0  Suicidal thoughts 0  PHQ-9 Score 0      Data saved with a previous flowsheet row definition     Current Outpatient Medications  Medication Instructions   aspirin  162 mg, Oral, Daily   Blood Pressure Monitoring (BLOOD PRESSURE KIT) KIT Arm circumference- 14.75inches  Ht: 20ft 8 Weight 279lbs  Address- [address]  [place] [address]   Medicaid #[id number]   Cholecalciferol 125 MCG (5000 UT) TABS 1 tablet, Daily   famotidine  (PEPCID ) 20 mg, Oral, 2 times daily   Ferrous Fumarate  324 MG TABS 1 tablet, Oral, Every other day   folic acid (FOLVITE) 1 mg, Daily   glucose blood (EASY MAX BLOOD GLUCOSE TEST) test strip Check blood sugar four times daily   labetalol  (NORMODYNE ) 200 mg, Oral, 2 times daily   metFORMIN   (GLUCOPHAGE ) 1,000 mg, Oral, 2 times daily with meals   NIFEdipine  (PROCARDIA  XL) 30 mg, Oral, Daily   pantoprazole  (PROTONIX ) 40 MG tablet 1 tablet by mouth once daily   TRUEplus Lancets 33G MISC 1 Device, Does not apply, 4 times daily, Check blood sugar four times daily   Vitamin D (Ergocalciferol) (DRISDOL) 50,000 Units, Every 7 days     Review of Systems:   Pertinent items are noted in HPI Denies abnormal vaginal discharge w/ itching/odor/irritation, visual changes, shortness of breath, chest pain, abdominal pain, severe nausea/vomiting, or problems with urination or bowel movements unless otherwise stated above. Pertinent History Reviewed:  Reviewed past medical,surgical, social, obstetrical and family history.  Reviewed problem list, medications and allergies. Physical Assessment:   Vitals:   10/10/24 0938  BP: 126/79  Pulse: 74  Weight: 273 lb (123.8 kg)  Body mass index is 44.06 kg/m.           Physical Examination:   General appearance: alert, well appearing, and in no distress  Mental status: normal mood, behavior, speech, dress, motor activity, and thought processes  Skin: warm & dry   Extremities:   no edema   Cardiovascular: normal heart rate noted  Respiratory: normal respiratory effort, no distress  Abdomen: gravid, soft, non-tender  Pelvic: Cervical exam deferred         Fetal Status:     Movement: Present    Fetal Surveillance Testing today: cephalic,cx  4.9 cm,anterior placenta gr 0,AFI 18 cm,FHR 142 bpm,prominent CSP 8.8 mm,EFW 1325 g 66%    Chaperone: N/A    No results found for this or any previous visit (from the past 24 hours).   Assessment & Plan:  High-risk pregnancy: G3P0111 at [redacted]w[redacted]d with an Estimated Date of Delivery: 12/31/24   1) Class B DM on MTF 1000mg  am/ 500mg  pm -No change today -Growth AGA- continue growth q 4wks - Antepartum testing to start at 32 weeks  2) Chronic HTN - Continue current medication 3) Prior C-section desires TOLAC -  We discussed her history of c-section. Her previous c-section was due to  non-reassuring fetal heart tones.  She has a history of  no prior successful vaginal deliveries - We discussed the risks associated with repeat c-section: bleeding, infection, injury to surrounding organs/tissues I.e. bowel/bladder, development of scar tissue, wound complications such as wound separation or infection, need for additional surgery and discussed potential for future complications such as placenta accreta spectrum  nm- We discussed the risks associated with TOLAC specifically focusing on the risk of uterine rupture. We discussed with the risk of uterine rupture that while rare it is not easily predicted, that it is a surgical emergency, and it can be potentially catastrophic for mom and baby.  - After counseling, the patient was given the opportunity to ask questions and all questions answered.  - After considering her options, she would like to [facility]  - Informed consent obtained  4) obesity 5) anemia  []  letter for work- dates of appointments/ ability to check sugars And missed work 11/6  Meds: No orders of the defined types were placed in this encounter.   Labs/procedures today: growth scan  Treatment Plan: Routine OB care and as outlined above  Reviewed: Preterm labor symptoms and general obstetric precautions including but not limited to vaginal bleeding, contractions, leaking of fluid and fetal movement were reviewed in detail with the patient.  All questions were answered.  Patient has home bp cuff. Check bp weekly, let us  know if >160/110  Follow-up: Return for 2 weeks HROB visit, growth q 4wks, BPP/NST twice weekly starting 4wks.   Future Appointments  Date Time Provider Department Center  10/28/2024  8:50 AM Marilynn Nest, DO [facility]  11/05/2024  8:30 AM [facility] 2 [facility] None  11/05/2024  9:30 AM Jayne Vonn DEL, MD [facility]  11/08/2024  9:10 AM [facility] NURSE [facility]  11/12/2024  2:15 PM [facility]G 2 [facility] None  11/12/2024  3:10 PM Jayne Vonn DEL, MD [facility]  11/15/2024  9:10 AM [facility] NURSE [facility]  11/19/2024  8:30 AM [facility] 2 [facility] None  11/19/2024  9:30 AM Kizzie Suzen SAUNDERS, CNM [facility]  11/26/2024  9:15 AM [facility] US  [facility] None  11/26/2024 10:10 AM Kizzie Suzen SAUNDERS, CNM [facility]  11/29/2024  9:50 AM [facility] NURSE [facility]  12/03/2024  8:30 AM [facility]G 2 [facility] None  12/06/2024  9:10 AM [facility] NURSE [facility]  12/10/2024  8:30 AM [facility] US  [facility] None  12/13/2024  9:10 AM [facility] NURSE [facility]  12/17/2024  8:30 AM [facility] FT IMG 2 [facility] None  12/20/2024  9:10 AM [facility] NURSE [facility]  12/24/2024  8:30 AM [facility] - [facility] US  [facility] None  12/27/2024  9:10 AM [facility] NURSE [facility]  12/31/2024  8:30 AM [facility] FT IMG 2 [facility]  None    Orders Placed This Encounter  Procedures   US  FETAL BPP WO NON STRESS    Kyndle Schlender, DO Attending Obstetrician & Gynecologist, [facility] [employer], [facility]

## 2024-10-10 NOTE — Progress Notes (Signed)
 US  28+2 wks,cephalic,cx 4.9 cm,anterior placenta gr 0,AFI 18 cm,FHR 142 bpm,prominent CSP 8.8 mm,EFW 1325 g 66%

## 2024-10-11 ENCOUNTER — Encounter: Payer: Self-pay | Admitting: Obstetrics & Gynecology

## 2024-10-11 LAB — CBC
Hematocrit: 30.7 % — ABNORMAL LOW (ref 34.0–46.6)
Hemoglobin: 9.2 g/dL — ABNORMAL LOW (ref 11.1–15.9)
MCH: 22.7 pg — ABNORMAL LOW (ref 26.6–33.0)
MCHC: 30 g/dL — ABNORMAL LOW (ref 31.5–35.7)
MCV: 76 fL — ABNORMAL LOW (ref 79–97)
Platelets: 233 x10E3/uL (ref 150–450)
RBC: 4.05 x10E6/uL (ref 3.77–5.28)
RDW: 16.2 % — ABNORMAL HIGH (ref 11.7–15.4)
WBC: 7.1 x10E3/uL (ref 3.4–10.8)

## 2024-10-11 LAB — HIV ANTIBODY (ROUTINE TESTING W REFLEX): HIV Screen 4th Generation wRfx: NONREACTIVE

## 2024-10-11 LAB — ANTIBODY SCREEN: Antibody Screen: NEGATIVE

## 2024-10-11 LAB — RPR: RPR Ser Ql: NONREACTIVE

## 2024-10-21 ENCOUNTER — Ambulatory Visit

## 2024-10-21 ENCOUNTER — Other Ambulatory Visit: Payer: Self-pay | Admitting: Obstetrics & Gynecology

## 2024-10-21 ENCOUNTER — Encounter: Payer: Self-pay | Admitting: Obstetrics & Gynecology

## 2024-10-21 ENCOUNTER — Encounter: Payer: Self-pay | Admitting: Women's Health

## 2024-10-21 VITALS — BP 136/88 | HR 87

## 2024-10-21 DIAGNOSIS — Z3A29 29 weeks gestation of pregnancy: Secondary | ICD-10-CM

## 2024-10-21 DIAGNOSIS — O0990 Supervision of high risk pregnancy, unspecified, unspecified trimester: Secondary | ICD-10-CM

## 2024-10-21 DIAGNOSIS — O10913 Unspecified pre-existing hypertension complicating pregnancy, third trimester: Secondary | ICD-10-CM | POA: Diagnosis not present

## 2024-10-21 DIAGNOSIS — O10919 Unspecified pre-existing hypertension complicating pregnancy, unspecified trimester: Secondary | ICD-10-CM

## 2024-10-21 DIAGNOSIS — D508 Other iron deficiency anemias: Secondary | ICD-10-CM

## 2024-10-21 LAB — POCT URINALYSIS DIPSTICK OB
Blood, UA: NEGATIVE
Leukocytes, UA: NEGATIVE
Nitrite, UA: NEGATIVE

## 2024-10-21 MED ORDER — ACCRUFER 30 MG PO CAPS: 1.0000 | ORAL_CAPSULE | Freq: Every day | ORAL | 11 refills | Status: AC

## 2024-10-21 NOTE — Progress Notes (Signed)
   NURSE VISIT- BLOOD PRESSURE CHECK  SUBJECTIVE:  Candice Campos is a 33 y.o. [phone] female here for BP check. She is [redacted]w[redacted]d pregnant    HYPERTENSION ROS:  Pregnant Severe headaches that don't go away with tylenol /other medicines: No  Visual changes (seeing spots/double/blurred vision) No  Severe pain under right breast breast or in center of upper chest No  Severe nausea/vomiting No  Taking medicines as instructed yes    OBJECTIVE:  BP 136/88 (BP Location: Right Arm, Patient Position: Sitting, Cuff Size: Large)   Pulse 87   LMP  (LMP Unknown)   Appearance alert, well appearing, and in no distress.  ASSESSMENT: Pregnancy [redacted]w[redacted]d  blood pressure check  PLAN: Discussed with Dr. Ozan   Recommendations: Patient to be keeping track of Bps at home, bring home cuff to 10/28/24 appointment. Take iron every other day new prescription to be sent in.    Follow-up: as scheduled   Aleck FORBES Blase  10/21/2024 10:17 AM

## 2024-10-21 NOTE — Progress Notes (Signed)
 Rx for accrufer  for iron

## 2024-10-23 ENCOUNTER — Encounter: Payer: Self-pay | Admitting: Obstetrics & Gynecology

## 2024-10-28 ENCOUNTER — Ambulatory Visit: Admitting: Obstetrics & Gynecology

## 2024-10-28 ENCOUNTER — Encounter: Payer: Self-pay | Admitting: Obstetrics & Gynecology

## 2024-10-28 VITALS — BP 134/81 | HR 82 | Wt 277.2 lb

## 2024-10-28 DIAGNOSIS — O10919 Unspecified pre-existing hypertension complicating pregnancy, unspecified trimester: Secondary | ICD-10-CM

## 2024-10-28 DIAGNOSIS — D508 Other iron deficiency anemias: Secondary | ICD-10-CM

## 2024-10-28 DIAGNOSIS — O0990 Supervision of high risk pregnancy, unspecified, unspecified trimester: Secondary | ICD-10-CM

## 2024-10-28 DIAGNOSIS — O24913 Unspecified diabetes mellitus in pregnancy, third trimester: Secondary | ICD-10-CM

## 2024-10-28 NOTE — Progress Notes (Signed)
 HIGH-RISK PREGNANCY VISIT Patient name: Candice Campos MRN [id number]  Date of birth: 10-10-1991 Chief Complaint:   Routine Prenatal Visit  History of Present Illness:   Candice Campos is a 33 y.o. [id number] female at [redacted]w[redacted]d with an Estimated Date of Delivery: 12/31/24 being seen today for ongoing management of a high-risk pregnancy complicated by:  1) Class B DM on MTF 1000mg  am/ 500mg  pm -only occasion elevated, mostly well controlled   2) Chronic HTN-doing well with current medication Currently asymptomatic  3) Prior C-section desires TOLAC 4) obesity 5) anemia -picked up accrufer , but has not yet started.  Concerned about GI issues.  Today she reports still struggling at work regarding requirement, difficulty walking.   Contractions: Not present. Vag. Bleeding: None.  Movement: Present. denies leaking of fluid.      06/12/2024    9:46 AM  Depression screen PHQ 2/9  Decreased Interest 0  Down, Depressed, Hopeless 0  PHQ - 2 Score 0  Altered sleeping 0  Tired, decreased energy 0  Change in appetite 0  Feeling bad or failure about yourself  0  Trouble concentrating 0  Moving slowly or fidgety/restless 0  Suicidal thoughts 0  PHQ-9 Score 0      Data saved with a previous flowsheet row definition     Current Outpatient Medications  Medication Instructions   ACCRUFeR  30 mg, Oral, Daily   aspirin  162 mg, Oral, Daily   Blood Pressure Monitoring (BLOOD PRESSURE KIT) KIT Arm circumference- 14.75inches  Ht: 83ft 8 Weight 279lbs  Address- [address]  [place] [address]   Medicaid #[id number]   Cholecalciferol 125 MCG (5000 UT) TABS 1 tablet, Daily   famotidine  (PEPCID ) 20 mg, Oral, 2 times daily   folic acid (FOLVITE) 1 mg, Daily   glucose blood (EASY MAX BLOOD GLUCOSE TEST) test strip Check blood sugar four times daily   labetalol  (NORMODYNE ) 200 mg, Oral, 2 times daily   metFORMIN  (GLUCOPHAGE ) 1,000 mg, Oral, 2 times daily with meals   NIFEdipine  (PROCARDIA  XL)  30 mg, Oral, Daily   pantoprazole  (PROTONIX ) 40 MG tablet 1 tablet by mouth once daily   TRUEplus Lancets 33G MISC 1 Device, Does not apply, 4 times daily, Check blood sugar four times daily   Vitamin D (Ergocalciferol) (DRISDOL) 50,000 Units, Every 7 days     Review of Systems:   Pertinent items are noted in HPI Denies abnormal vaginal discharge w/ itching/odor/irritation, headaches, visual changes, shortness of breath, chest pain, abdominal pain, severe nausea/vomiting, or problems with urination or bowel movements unless otherwise stated above. Pertinent History Reviewed:  Reviewed past medical,surgical, social, obstetrical and family history.  Reviewed problem list, medications and allergies. Physical Assessment:   Vitals:   10/28/24 0847  BP: 134/81  Pulse: 82  Weight: 277 lb 3.2 oz (125.7 kg)  Body mass index is 44.74 kg/m.           Physical Examination:   General appearance: alert, well appearing, and in no distress  Mental status: normal mood, behavior, speech, dress, motor activity, and thought processes  Skin: warm & dry   Extremities:      Cardiovascular: normal heart rate noted  Respiratory: normal respiratory effort, no distress  Abdomen: gravid, soft, non-tender  Pelvic: Cervical exam deferred         Fetal Status: Fetal Heart Rate (bpm): 145 Fundal Height: 41 cm Movement: Present    Fetal Surveillance Testing today: doppler   Chaperone: N/A    No results  found for this or any previous visit (from the past 24 hours).   Assessment & Plan:  High-risk pregnancy: [id number] at [redacted]w[redacted]d with an Estimated Date of Delivery: 12/31/24   1) Class B DM on MTF 1000mg  am/ 500mg  pm - no change -continue serial growth scans -antepartum testing scheduled   2) Chronic HTN- No change to current meds  3) Prior C-section desires TOLAC 4) obesity 5) anemia Strongly encouraged pt to start accrufer   Meds: No orders of the defined types were placed in this  encounter.   Labs/procedures today: doppler  Treatment Plan:  routine OB care and as outlined above -note to be put in mychart about leave from work  Reviewed: Preterm labor symptoms and general obstetric precautions including but not limited to vaginal bleeding, contractions, leaking of fluid and fetal movement were reviewed in detail with the patient.  All questions were answered. Pt has home bp cuff. Check bp weekly, let us  know if >160/110  Follow-up: Return for HROB/as scheduled.   Future Appointments  Date Time Provider Department Center  11/05/2024  8:30 AM [facility] - FT IMG 2 [facility] None  11/05/2024  9:30 AM Jayne Vonn DEL, MD [facility]  11/08/2024  9:10 AM [facility] NURSE [facility]  11/12/2024  2:15 PM [facility] FT IMG 2 [facility] None  11/12/2024  3:10 PM Jayne Vonn DEL, MD [facility]  11/15/2024  9:10 AM [facility] NURSE [facility]  11/19/2024  8:30 AM [facility] FT IMG 2 [facility] None  11/19/2024  9:30 AM Kizzie Suzen SAUNDERS, CNM [facility]  11/26/2024  9:15 AM [facility] - [facility] US  [facility] None  11/26/2024 10:10 AM Kizzie Suzen SAUNDERS, CNM [facility]  11/29/2024  9:50 AM [facility] NURSE [facility]  12/03/2024  8:30 AM [facility] FT IMG 2 [facility] None  12/03/2024  9:30 AM Jayne Vonn DEL, MD [facility]  12/06/2024  9:10 AM [facility] NURSE [facility]  12/10/2024  8:30 AM [facility] - [facility] US  [facility] None  12/10/2024  9:30 AM Tristy Udovich, DO [facility]  12/13/2024  9:10 AM [facility] NURSE [facility]  12/17/2024  8:30 AM [facility] FT IMG 2 [facility] None  12/17/2024  9:30 AM Kizzie Suzen SAUNDERS, CNM [facility]  12/20/2024  9:10 AM [facility] NURSE [facility]  12/24/2024  8:30 AM [facility] - [facility] US  [facility] None  12/24/2024  9:30 AM Marilynn Nest, DO [facility]  12/27/2024  9:10 AM [facility] NURSE [facility]  12/31/2024  8:30 AM [facility] FT IMG 2 [facility] None  12/31/2024  9:30 AM Eure, Vonn DEL, MD [facility]    No orders of the defined  types were placed in this encounter.   Tieshia Rettinger, DO Attending Obstetrician & Gynecologist, [facility] for [employer], [facility]

## 2024-11-04 ENCOUNTER — Other Ambulatory Visit: Payer: Self-pay | Admitting: Obstetrics & Gynecology

## 2024-11-04 DIAGNOSIS — O10919 Unspecified pre-existing hypertension complicating pregnancy, unspecified trimester: Secondary | ICD-10-CM

## 2024-11-04 DIAGNOSIS — O0990 Supervision of high risk pregnancy, unspecified, unspecified trimester: Secondary | ICD-10-CM

## 2024-11-04 DIAGNOSIS — O24319 Unspecified pre-existing diabetes mellitus in pregnancy, unspecified trimester: Secondary | ICD-10-CM

## 2024-11-04 DIAGNOSIS — Z98891 History of uterine scar from previous surgery: Secondary | ICD-10-CM

## 2024-11-04 DIAGNOSIS — Z6841 Body Mass Index (BMI) 40.0 and over, adult: Secondary | ICD-10-CM

## 2024-11-04 DIAGNOSIS — Z8759 Personal history of other complications of pregnancy, childbirth and the puerperium: Secondary | ICD-10-CM

## 2024-11-04 DIAGNOSIS — Z3A32 32 weeks gestation of pregnancy: Secondary | ICD-10-CM

## 2024-11-05 ENCOUNTER — Encounter: Admitting: Obstetrics & Gynecology

## 2024-11-05 ENCOUNTER — Other Ambulatory Visit: Admitting: Radiology

## 2024-11-06 ENCOUNTER — Other Ambulatory Visit: Payer: Self-pay | Admitting: Obstetrics & Gynecology

## 2024-11-06 ENCOUNTER — Ambulatory Visit

## 2024-11-06 ENCOUNTER — Encounter: Payer: Self-pay | Admitting: Obstetrics & Gynecology

## 2024-11-06 VITALS — BP 131/84

## 2024-11-06 DIAGNOSIS — O10919 Unspecified pre-existing hypertension complicating pregnancy, unspecified trimester: Secondary | ICD-10-CM

## 2024-11-06 DIAGNOSIS — Z98891 History of uterine scar from previous surgery: Secondary | ICD-10-CM

## 2024-11-06 DIAGNOSIS — O99019 Anemia complicating pregnancy, unspecified trimester: Secondary | ICD-10-CM

## 2024-11-06 DIAGNOSIS — Z8759 Personal history of other complications of pregnancy, childbirth and the puerperium: Secondary | ICD-10-CM

## 2024-11-06 DIAGNOSIS — Z3A33 33 weeks gestation of pregnancy: Secondary | ICD-10-CM

## 2024-11-06 DIAGNOSIS — O24319 Unspecified pre-existing diabetes mellitus in pregnancy, unspecified trimester: Secondary | ICD-10-CM

## 2024-11-06 DIAGNOSIS — O34219 Maternal care for unspecified type scar from previous cesarean delivery: Secondary | ICD-10-CM

## 2024-11-06 DIAGNOSIS — O0990 Supervision of high risk pregnancy, unspecified, unspecified trimester: Secondary | ICD-10-CM

## 2024-11-06 DIAGNOSIS — Z6841 Body Mass Index (BMI) 40.0 and over, adult: Secondary | ICD-10-CM

## 2024-11-06 DIAGNOSIS — Z0130 Encounter for examination of blood pressure without abnormal findings: Secondary | ICD-10-CM

## 2024-11-06 DIAGNOSIS — O24913 Unspecified diabetes mellitus in pregnancy, third trimester: Secondary | ICD-10-CM

## 2024-11-06 MED ORDER — LABETALOL HCL 200 MG PO TABS: 200.0000 mg | ORAL_TABLET | Freq: Two times a day (BID) | ORAL | 3 refills | Status: DC

## 2024-11-06 NOTE — Progress Notes (Signed)
 Rx for labetalol  sent in, pt states she ran out  Zane Pellecchia, DO Attending Obstetrician & Gynecologist, [facility] for [employer], [facility]

## 2024-11-06 NOTE — Progress Notes (Signed)
° °  NURSE VISIT- BLOOD PRESSURE CHECK  SUBJECTIVE:  Candice Campos is a 33 y.o. [phone] female here for BP check. She is [redacted]w[redacted]d pregnant   Patient also complaining of congestion and post-nasal drip.  HYPERTENSION ROS:  Pregnant/postpartum:  Severe headaches that don't go away with tylenol /other medicines: No  Visual changes (seeing spots/double/blurred vision) No  Severe pain under right breast breast or in center of upper chest No  Severe nausea/vomiting No  Taking medicines as instructed yes   OBJECTIVE:  BP 131/84   LMP  (LMP Unknown)   Appearance alert, well appearing, and in no distress.  ASSESSMENT: Pregnancy [redacted]w[redacted]d  blood pressure check  PLAN: Safe Medications in Pregnancy Colds/Coughs/Allergies: Benadryl (alcohol free) 25 mg every 6 hours as needed Breath right strips Claritin Cepacol throat lozenges Chloraseptic throat spray Cold-Eeze- up to three times per day Cough drops, alcohol free Flonase (by prescription only) Guaifenesin  Mucinex  Robitussin DM (plain only, alcohol free) Saline nasal spray/drops Sudafed (pseudoephedrine) & Actifed ** use only after [redacted] weeks gestation and if you do not have high blood pressure Tylenol  Vicks Vaporub Zinc lozenges Zyrtec  **If taking multiple medications, please check labels to avoid duplicating the same active ingredients **take medication as directed on the label ** Do not exceed 4000 mg of tylenol  in 24 hours **Do not take medications that contain aspirin  or ibuprofen      Discussed with Dr. Ozan   Recommendations: Patient is to bring home cuffs with her on Friday to evaluate how accurate they are.    Follow-up: as scheduled   Torrin Crihfield E Bruchy Mikel  11/06/2024 11:10 AM

## 2024-11-07 ENCOUNTER — Encounter: Payer: Self-pay | Admitting: Obstetrics & Gynecology

## 2024-11-08 ENCOUNTER — Ambulatory Visit: Admitting: Obstetrics & Gynecology

## 2024-11-08 ENCOUNTER — Other Ambulatory Visit

## 2024-11-08 ENCOUNTER — Encounter: Payer: Self-pay | Admitting: Obstetrics & Gynecology

## 2024-11-08 VITALS — BP 131/90 | HR 97 | Wt 278.0 lb

## 2024-11-08 DIAGNOSIS — O10919 Unspecified pre-existing hypertension complicating pregnancy, unspecified trimester: Secondary | ICD-10-CM

## 2024-11-08 DIAGNOSIS — O0990 Supervision of high risk pregnancy, unspecified, unspecified trimester: Secondary | ICD-10-CM

## 2024-11-08 DIAGNOSIS — Z8759 Personal history of other complications of pregnancy, childbirth and the puerperium: Secondary | ICD-10-CM

## 2024-11-08 DIAGNOSIS — O34219 Maternal care for unspecified type scar from previous cesarean delivery: Secondary | ICD-10-CM

## 2024-11-08 DIAGNOSIS — Z98891 History of uterine scar from previous surgery: Secondary | ICD-10-CM

## 2024-11-08 DIAGNOSIS — O24913 Unspecified diabetes mellitus in pregnancy, third trimester: Secondary | ICD-10-CM

## 2024-11-08 DIAGNOSIS — Z6841 Body Mass Index (BMI) 40.0 and over, adult: Secondary | ICD-10-CM

## 2024-11-08 DIAGNOSIS — Z3A33 33 weeks gestation of pregnancy: Secondary | ICD-10-CM

## 2024-11-08 DIAGNOSIS — O24319 Unspecified pre-existing diabetes mellitus in pregnancy, unspecified trimester: Secondary | ICD-10-CM

## 2024-11-08 DIAGNOSIS — O99019 Anemia complicating pregnancy, unspecified trimester: Secondary | ICD-10-CM

## 2024-11-08 NOTE — Progress Notes (Signed)
 HIGH-RISK PREGNANCY VISIT Patient name: Candice Campos MRN [id number]  Date of birth: May 24, 1991 Chief Complaint:   Routine Prenatal Visit  History of Present Illness:   Candice Campos is a 33 y.o. H6E9888 female at [redacted]w[redacted]d with an Estimated Date of Delivery: 12/31/24 being seen today for ongoing management of a high-risk pregnancy complicated by     ICD-10-CM   1. Supervision of high risk pregnancy, antepartum  O09.90     2. Chronic hypertension affecting pregnancy: procardia  30  O10.919     3. Class B- MTF 1000/500  O24.913     4. Previous cesarean section  Z98.891      .    Today she reports no complaints. Contractions: Not present. Vag. Bleeding: None.  Movement: Present. denies leaking of fluid.      06/12/2024    9:46 AM  Depression screen PHQ 2/9  Decreased Interest 0  Down, Depressed, Hopeless 0  PHQ - 2 Score 0  Altered sleeping 0  Tired, decreased energy 0  Change in appetite 0  Feeling bad or failure about yourself  0  Trouble concentrating 0  Moving slowly or fidgety/restless 0  Suicidal thoughts 0  PHQ-9 Score 0      Data saved with a previous flowsheet row definition        06/12/2024    9:46 AM  GAD 7 : Generalized Anxiety Score  Nervous, Anxious, on Edge 0  Control/stop worrying 0  Worry too much - different things 0  Trouble relaxing 0  Restless 0  Easily annoyed or irritable 0  Afraid - awful might happen 0  Total GAD 7 Score 0     Review of Systems:   Pertinent items are noted in HPI Denies abnormal vaginal discharge w/ itching/odor/irritation, headaches, visual changes, shortness of breath, chest pain, abdominal pain, severe nausea/vomiting, or problems with urination or bowel movements unless otherwise stated above. Pertinent History Reviewed:  Reviewed past medical,surgical, social, obstetrical and family history.  Reviewed problem list, medications and allergies. Physical Assessment:   Vitals:   11/08/24 1054 11/08/24 1105   BP: (!) 143/85 (!) 131/90  Pulse: 90 97  Weight: 278 lb (126.1 kg)   Body mass index is 44.87 kg/m.           Physical Examination:   General appearance: alert, well appearing, and in no distress  Mental status: alert, oriented to person, place, and time  Skin: warm & dry   Extremities:      Cardiovascular: normal heart rate noted  Respiratory: normal respiratory effort, no distress  Abdomen: gravid, soft, non-tender  Pelvic: Cervical exam deferred         Fetal Status:     Movement: Present    Fetal Surveillance Testing today: BPP 8/8/ UAD 55% EFW 63%   Chaperone: N/A    No results found for this or any previous visit (from the past 24 hours).  Assessment & Plan:  High-risk pregnancy: H6E9888 at [redacted]w[redacted]d with an Estimated Date of Delivery: 12/31/24      ICD-10-CM   1. Supervision of high risk pregnancy, antepartum  O09.90     2. Chronic hypertension affecting pregnancy: procardia  30  O10.919     3. Class B- MTF 1000/500  O24.913     4. Previous cesarean section  Z98.891          Meds: No orders of the defined types were placed in this encounter.   Orders: No orders of the defined types  were placed in this encounter.    Labs/procedures today: U/S  Treatment Plan:  twice weekly surveillance    Follow-up: Return for keep scheduled.   Future Appointments  Date Time Provider Department Center  11/12/2024  2:15 PM [facility] - FT IMG 2 [facility] None  11/12/2024  3:10 PM Kizzie Suzen SAUNDERS, CNM [facility]  11/15/2024  9:10 AM [facility] NURSE [facility]  11/19/2024  8:30 AM [facility] - FT IMG 2 [facility] None  11/19/2024  9:30 AM Kizzie Suzen SAUNDERS, CNM [facility]  11/26/2024  9:15 AM [facility] - [facility] US  [facility] None  11/26/2024 10:10 AM Kizzie Suzen SAUNDERS, CNM [facility]  11/29/2024  9:50 AM [facility] NURSE [facility]  12/03/2024  8:30 AM [facility] - FT IMG 2 [facility] None  12/03/2024  9:30 AM Jayne Vonn DEL, MD [facility]  12/06/2024  9:10 AM [facility]  NURSE [facility]  12/10/2024  8:30 AM [facility] - [facility] US  [facility] None  12/10/2024  9:30 AM Ozan, Jennifer, DO [facility]  12/13/2024  9:10 AM [facility] NURSE [facility]  12/17/2024  8:30 AM [facility] - FT IMG 2 [facility] None  12/17/2024  9:30 AM Kizzie Suzen SAUNDERS, CNM [facility]  12/20/2024  9:10 AM [facility] NURSE [facility]  12/24/2024  8:30 AM [facility] - [facility] US  [facility]FT[facility] None  12/24/2024  9:30 AM Marilynn Nest, DO [facility]  12/27/2024  9:10 AM [facility] NURSE [facility]  12/31/2024  8:30 AM [facility] - FT IMG 2 [facility] None  12/31/2024  9:30 AM Jayne Vonn DEL, MD [facility]    No orders of the defined types were placed in this encounter.  Vonn DEL Jayne  Attending Physician for the Center for [facility] 11/08/2024 11:14 AM

## 2024-11-08 NOTE — Progress Notes (Signed)
 US  32+3 wks,complete breech,anterior placenta gr 1,cx 3.5 cm,FHR 129 BPM,AFI 23 cm,RI .64,.55,.65=55%,BPP 8/8

## 2024-11-11 ENCOUNTER — Other Ambulatory Visit: Payer: Self-pay | Admitting: Obstetrics & Gynecology

## 2024-11-11 DIAGNOSIS — O34219 Maternal care for unspecified type scar from previous cesarean delivery: Secondary | ICD-10-CM

## 2024-11-11 DIAGNOSIS — Z6841 Body Mass Index (BMI) 40.0 and over, adult: Secondary | ICD-10-CM

## 2024-11-11 DIAGNOSIS — Z98891 History of uterine scar from previous surgery: Secondary | ICD-10-CM

## 2024-11-11 DIAGNOSIS — O0990 Supervision of high risk pregnancy, unspecified, unspecified trimester: Secondary | ICD-10-CM

## 2024-11-11 DIAGNOSIS — O24913 Unspecified diabetes mellitus in pregnancy, third trimester: Secondary | ICD-10-CM

## 2024-11-11 DIAGNOSIS — O99019 Anemia complicating pregnancy, unspecified trimester: Secondary | ICD-10-CM

## 2024-11-11 DIAGNOSIS — Z3A33 33 weeks gestation of pregnancy: Secondary | ICD-10-CM

## 2024-11-11 DIAGNOSIS — O10919 Unspecified pre-existing hypertension complicating pregnancy, unspecified trimester: Secondary | ICD-10-CM

## 2024-11-12 ENCOUNTER — Ambulatory Visit: Admitting: Women's Health

## 2024-11-12 ENCOUNTER — Encounter: Payer: Self-pay | Admitting: Women's Health

## 2024-11-12 ENCOUNTER — Other Ambulatory Visit: Admitting: Radiology

## 2024-11-12 ENCOUNTER — Other Ambulatory Visit: Payer: Self-pay | Admitting: Obstetrics & Gynecology

## 2024-11-12 VITALS — BP 136/83 | HR 86 | Wt 279.0 lb

## 2024-11-12 DIAGNOSIS — O0990 Supervision of high risk pregnancy, unspecified, unspecified trimester: Secondary | ICD-10-CM

## 2024-11-12 DIAGNOSIS — Z3A34 34 weeks gestation of pregnancy: Secondary | ICD-10-CM

## 2024-11-12 DIAGNOSIS — O24113 Pre-existing diabetes mellitus, type 2, in pregnancy, third trimester: Secondary | ICD-10-CM

## 2024-11-12 DIAGNOSIS — Z98891 History of uterine scar from previous surgery: Secondary | ICD-10-CM

## 2024-11-12 DIAGNOSIS — Z6841 Body Mass Index (BMI) 40.0 and over, adult: Secondary | ICD-10-CM

## 2024-11-12 DIAGNOSIS — O34219 Maternal care for unspecified type scar from previous cesarean delivery: Secondary | ICD-10-CM

## 2024-11-12 DIAGNOSIS — O24319 Unspecified pre-existing diabetes mellitus in pregnancy, unspecified trimester: Secondary | ICD-10-CM

## 2024-11-12 DIAGNOSIS — O99019 Anemia complicating pregnancy, unspecified trimester: Secondary | ICD-10-CM

## 2024-11-12 DIAGNOSIS — Z3A33 33 weeks gestation of pregnancy: Secondary | ICD-10-CM

## 2024-11-12 DIAGNOSIS — Z7984 Long term (current) use of oral hypoglycemic drugs: Secondary | ICD-10-CM

## 2024-11-12 DIAGNOSIS — E119 Type 2 diabetes mellitus without complications: Secondary | ICD-10-CM | POA: Diagnosis not present

## 2024-11-12 DIAGNOSIS — O24913 Unspecified diabetes mellitus in pregnancy, third trimester: Secondary | ICD-10-CM

## 2024-11-12 DIAGNOSIS — O0993 Supervision of high risk pregnancy, unspecified, third trimester: Secondary | ICD-10-CM

## 2024-11-12 DIAGNOSIS — Z8759 Personal history of other complications of pregnancy, childbirth and the puerperium: Secondary | ICD-10-CM

## 2024-11-12 DIAGNOSIS — O10919 Unspecified pre-existing hypertension complicating pregnancy, unspecified trimester: Secondary | ICD-10-CM

## 2024-11-12 DIAGNOSIS — O1213 Gestational proteinuria, third trimester: Secondary | ICD-10-CM

## 2024-11-12 DIAGNOSIS — Z331 Pregnant state, incidental: Secondary | ICD-10-CM

## 2024-11-12 DIAGNOSIS — O10913 Unspecified pre-existing hypertension complicating pregnancy, third trimester: Secondary | ICD-10-CM

## 2024-11-12 DIAGNOSIS — Z1389 Encounter for screening for other disorder: Secondary | ICD-10-CM

## 2024-11-12 LAB — POCT URINALYSIS DIPSTICK OB
Blood, UA: NEGATIVE
Ketones, UA: NEGATIVE
Leukocytes, UA: NEGATIVE
Nitrite, UA: NEGATIVE

## 2024-11-12 NOTE — Progress Notes (Signed)
 US : GA = 33 weeks Single active female fetus, cephalic, FHR = 149 bpm, anterior pl, gr1, AFI = 18.1 cm, MVP = 5.9 cm, EFW 2212g, 57%, BPP = 8/8, RI: 0.49, 0.49, 0.53  avg: 0.50  8%,  poor resolution, cervix closed

## 2024-11-12 NOTE — Progress Notes (Signed)
 HIGH-RISK PREGNANCY VISIT Patient name: Candice Campos MRN [id number]  Date of birth: [birth year] Chief Complaint:   Routine Prenatal Visit (Will pee before leave)  History of Present Illness:   Candice Campos is a 33 y.o. [phone] female at [redacted]w[redacted]d with an Estimated Date of Delivery: 12/31/24 being seen today for ongoing management of a high-risk pregnancy complicated by chronic hypertension currently on nifedipine  30mg  daily, diabetes mellitus T2DM on metformin  1000AM/500PM, and PG BMI 45.    Today she reports all sugars wnl. Denies ha, visual changes, ruq/epigastric pain, n/v.   Contractions: Not present.  .  Movement: Present. denies leaking of fluid.      06/12/2024    9:46 AM  Depression screen PHQ 2/9  Decreased Interest 0  Down, Depressed, Hopeless 0  PHQ - 2 Score 0  Altered sleeping 0  Tired, decreased energy 0  Change in appetite 0  Feeling bad or failure about yourself  0  Trouble concentrating 0  Moving slowly or fidgety/restless 0  Suicidal thoughts 0  PHQ-9 Score 0      Data saved with a previous flowsheet row definition        06/12/2024    9:46 AM  GAD 7 : Generalized Anxiety Score  Nervous, Anxious, on Edge 0  Control/stop worrying 0  Worry too much - different things 0  Trouble relaxing 0  Restless 0  Easily annoyed or irritable 0  Afraid - awful might happen 0  Total GAD 7 Score 0     Review of Systems:   Pertinent items are noted in HPI Denies abnormal vaginal discharge w/ itching/odor/irritation, headaches, visual changes, shortness of breath, chest pain, abdominal pain, severe nausea/vomiting, or problems with urination or bowel movements unless otherwise stated above. Pertinent History Reviewed:  Reviewed past medical,surgical, social, obstetrical and family history.  Reviewed problem list, medications and allergies. Physical Assessment:   Vitals:   11/12/24 1447  BP: 136/83  Pulse: 86  Weight: 279 lb (126.6 kg)  Body mass index is  45.03 kg/m.           Physical Examination:   General appearance: alert, well appearing, and in no distress  Mental status: alert, oriented to person, place, and time  Skin: warm & dry   Extremities: Edema: None    Cardiovascular: normal heart rate noted  Respiratory: normal respiratory effort, no distress  Abdomen: gravid, soft, non-tender  Pelvic: Cervical exam deferred         Fetal Status:     Movement: Present    Fetal Surveillance Testing today: US : GA = 33 weeks Single active female fetus, cephalic, FHR = 149 bpm, anterior pl, gr1, AFI = 18.1 cm, MVP = 5.9 cm, EFW 2212g, 57%, BPP = 8/8, RI: 0.49, 0.49, 0.53  avg: 0.50  8%,  poor resolution, cervix closed  Chaperone: N/A  Results for orders placed or performed in visit on 11/12/24 (from the past 24 hours)  POC Urinalysis Dipstick OB   Collection Time: 11/12/24  3:48 PM  Result Value Ref Range   Color, UA     Clarity, UA     Glucose, UA Trace (A) Negative   Bilirubin, UA     Ketones, UA negative    Spec Grav, UA     Blood, UA negative    pH, UA     POC,PROTEIN,UA Moderate (2+) Negative, Trace, Small (1+), Moderate (2+), Large (3+), 4+   Urobilinogen, UA     Nitrite, UA negative  Leukocytes, UA Negative Negative   Appearance     Odor      Assessment & Plan:  High-risk pregnancy: [id number] at [redacted]w[redacted]d with an Estimated Date of Delivery: 12/31/24   1) CHTN w/ h/o severe pre-e, stable on nifedipine  30mg  daily, ASA, if home bp's >140/90 or pre-e sx let us  know. 2+ proteinuria, asymptomatic, send urine for cx and P:C  2) T2DM, stable on metformin  1000AM/500PM, EFW today 57%/AFI wnl  3) PGBMI 45> currently 45  4) Prev C/S> wants TOLAC, consent 11/13  Meds: No orders of the defined types were placed in this encounter.  Labs/procedures today: U/S  Treatment Plan:   EFW q 4w     2x/wk testing nst/sonos          Reviewed: Preterm labor symptoms and general obstetric precautions including but not limited to vaginal bleeding,  contractions, leaking of fluid and fetal movement were reviewed in detail with the patient.  All questions were answered. Does have home bp cuff. Office bp cuff given: not applicable. Check bp daily, let us  know if consistently >140 and/or >90.  Follow-up: Return for As scheduled.   Future Appointments  Date Time Provider Department Center  11/15/2024  9:10 AM [facility] NURSE [facility]  11/19/2024  8:30 AM [facility] 2 [facility] None  11/19/2024  9:30 AM Kizzie Suzen SAUNDERS, CNM [facility]  11/26/2024  9:15 AM [facility] US  [facility] None  11/26/2024 10:10 AM Kizzie Suzen SAUNDERS, CNM [facility]  11/29/2024  9:50 AM [facility] NURSE [facility]  12/03/2024  8:30 AM [facility] 2 [facility] None  12/03/2024  9:30 AM Jayne Vonn DEL, MD [facility]  12/06/2024  9:10 AM [facility] NURSE [facility]  12/10/2024  8:30 AM [facility] US  [facility] None  12/10/2024  9:30 AM Ozan, Jennifer, DO [facility]  12/13/2024  9:10 AM [facility] NURSE [facility]  12/17/2024  8:30 AM [facility] 2 [facility] None  12/17/2024  9:30 AM Kizzie Suzen SAUNDERS, CNM [facility]  12/20/2024  9:10 AM [facility] NURSE [facility]  12/24/2024  8:30 AM [facility] US  [facility] None  12/24/2024  9:30 AM Marilynn Nest, DO [facility]  12/27/2024  9:10 AM [facility] NURSE [facility]  12/31/2024  8:30 AM [facility] IMG 2 [facility] None  12/31/2024  9:30 AM Eure, Vonn DEL, MD [facility]    Orders Placed This Encounter  Procedures   Urine Culture   Protein / creatinine ratio, urine   POC Urinalysis Dipstick OB   Suzen SAUNDERS Kizzie CNM, [facility] 11/12/2024 5:05 PM

## 2024-11-12 NOTE — Patient Instructions (Signed)
 Candice Campos, thank you for choosing our office today! We appreciate the opportunity to meet your healthcare needs. You may receive a short survey by mail, e-mail, or through [employer]. If you are happy with your care we would appreciate if you could take just a few minutes to complete the survey questions. We read all of your comments and take your feedback very seriously. Thank you again for choosing our office.  Center for [employer] Team at [facility]  [facility] at [facility] ([address] [place], [place] [address]) Entrance C, located off of [address] Free 24/7 valet parking   CLASSES: Go to [email or web address] to register for classes (childbirth, breastfeeding, waterbirth, infant CPR, daddy bootcamp, etc.)  Call the office [phone]) or go to [facility] if: You begin to have strong, frequent contractions Your water breaks.  Sometimes it is a big gush of fluid, sometimes it is just a trickle that keeps getting your panties wet or running down your legs You have vaginal bleeding.  It is normal to have a small amount of spotting if your cervix was checked.  You don't feel your baby moving like normal.  If you don't, get you something to eat and drink and lay down and focus on feeling your baby move.   If your baby is still not moving like normal, you should call the office or go to [facility]  Call the office [phone]) or go to [facility] hospital for these signs of pre-eclampsia: Severe headache that does not go away with Tylenol  Visual changes- seeing spots, double, blurred vision Pain under your right breast or upper abdomen that does not go away with Tums or heartburn medicine Nausea and/or vomiting Severe swelling in your hands, feet, and face   Tdap Vaccine It is recommended that you get the Tdap vaccine during the third trimester of EACH pregnancy to help protect your baby from getting pertussis (whooping cough) 27-36 weeks is the BEST time to do  this so that you can pass the protection on to your baby. During pregnancy is better than after pregnancy, but if you are unable to get it during pregnancy it will be offered at the hospital.  You can get this vaccine with us , at the health department, your family doctor, or some local pharmacies Everyone who will be around your baby should also be up-to-date on their vaccines before the baby comes. Adults (who are not pregnant) only need 1 dose of Tdap during adulthood.   [facility] Pediatricians/Family Doctors [facility]): [address] [place], [phone]           [facility]: [address], [phone]                [facility]): [address], [phone] (call to ask if accepting patients) [facility]: [address], [place], [phone]    [facility] Pediatricians/Family Doctors [facility]): [phone] [address], [phone] [facility]: [address] [place], [phone] [facility]: [address], [phone]  [facility] Doctors  [facility]): [phone] [facility]: [address] [phone]   [facility] Doctors [facility]: [address], [phone]  [facility] Family Doctors  [facility]: [phone], [phone]  Home Blood Pressure Monitoring for Patients   Your provider has recommended that you check your  blood pressure (BP) at least once a week at home. If you do not have a blood pressure cuff at home, one will be provided for you. Contact your provider if you have not received your monitor within 1 week.   Helpful Tips for Accurate Home Blood Pressure Checks  Don't smoke, exercise, or drink caffeine 30 minutes before checking your BP Use the restroom before checking your BP (a full bladder can raise your  pressure) Relax in a comfortable upright chair Feet on the ground Left arm resting comfortably on a flat surface at the level of your heart Legs uncrossed Back supported Sit quietly and don't talk Place the cuff on your bare arm Adjust snuggly, so that only two fingertips can fit between your skin and the top of the cuff Check 2 readings separated by at least one minute Keep a log of your BP readings For a visual, please reference this diagram: http://ccnc.care/bpdiagram  Provider Name: [facility]     Phone: [phone]  Zone 1: ALL CLEAR  Continue to monitor your symptoms:  BP reading is less than 140 (top number) or less than 90 (bottom number)  No right upper stomach pain No headaches or seeing spots No feeling nauseated or throwing up No swelling in face and hands  Zone 2: CAUTION Call your doctor's office for any of the following:  BP reading is greater than 140 (top number) or greater than 90 (bottom number)  Stomach pain under your ribs in the middle or right side Headaches or seeing spots Feeling nauseated or throwing up Swelling in face and hands  Zone 3: EMERGENCY  Seek immediate medical care if you have any of the following:  BP reading is greater than160 (top number) or greater than 110 (bottom number) Severe headaches not improving with Tylenol  Serious difficulty catching your breath Any worsening symptoms from Zone 2  Preterm Labor and Birth Information  The normal length of a pregnancy is 39-41 weeks. Preterm labor is when labor starts before 37 completed weeks of pregnancy. What are the risk factors for preterm labor? Preterm labor is more likely to occur in women who: Have certain infections during pregnancy such as a bladder infection, sexually transmitted infection, or infection inside the uterus (chorioamnionitis). Have a shorter-than-normal cervix. Have gone into preterm labor before. Have had surgery on their cervix. Are younger than age 24  or older than age 12. Are African American. Are pregnant with twins or multiple babies (multiple gestation). Take street drugs or smoke while pregnant. Do not gain enough weight while pregnant. Became pregnant shortly after having been pregnant. What are the symptoms of preterm labor? Symptoms of preterm labor include: Cramps similar to those that can happen during a menstrual period. The cramps may happen with diarrhea. Pain in the abdomen or lower back. Regular uterine contractions that may feel like tightening of the abdomen. A feeling of increased pressure in the pelvis. Increased watery or bloody mucus discharge from the vagina. Water breaking (ruptured amniotic sac). Why is it important to recognize signs of preterm labor? It is important to recognize signs of preterm labor because babies who are born prematurely may not be fully developed. This can put them at an increased risk for: Long-term (chronic) heart and lung problems. Difficulty immediately after birth with regulating body systems, including blood sugar, body temperature, heart rate, and breathing rate. Bleeding in the brain. Cerebral palsy. Learning difficulties. Death. These risks are highest for babies who are born before 34 weeks  of pregnancy. How is preterm labor treated? Treatment depends on the length of your pregnancy, your condition, and the health of your baby. It may involve: Having a stitch (suture) placed in your cervix to prevent your cervix from opening too early (cerclage). Taking or being given medicines, such as: Hormone medicines. These may be given early in pregnancy to help support the pregnancy. Medicine to stop contractions. Medicines to help mature the babys lungs. These may be prescribed if the risk of delivery is high. Medicines to prevent your baby from developing cerebral palsy. If the labor happens before 34 weeks of pregnancy, you may need to stay in the hospital. What should I do if I  think I am in preterm labor? If you think that you are going into preterm labor, call your health care provider right away. How can I prevent preterm labor in future pregnancies? To increase your chance of having a full-term pregnancy: Do not use any tobacco products, such as cigarettes, chewing tobacco, and e-cigarettes. If you need help quitting, ask your health care provider. Do not use street drugs or medicines that have not been prescribed to you during your pregnancy. Talk with your health care provider before taking any herbal supplements, even if you have been taking them regularly. Make sure you gain a healthy amount of weight during your pregnancy. Watch for infection. If you think that you might have an infection, get it checked right away. Make sure to tell your health care provider if you have gone into preterm labor before. This information is not intended to replace advice given to you by your health care provider. Make sure you discuss any questions you have with your health care provider. Document Revised: 03/08/2019 Document Reviewed: 04/06/2016 Elsevier Patient Education  2020 [employer].

## 2024-11-14 LAB — PROTEIN / CREATININE RATIO, URINE
Creatinine, Urine: 200.1 mg/dL
Protein, Ur: 101.2 mg/dL
Protein/Creat Ratio: 506 mg/g{creat} — ABNORMAL HIGH (ref 0–200)

## 2024-11-15 ENCOUNTER — Ambulatory Visit

## 2024-11-15 VITALS — BP 130/85 | HR 81 | Wt 282.0 lb

## 2024-11-15 DIAGNOSIS — O10919 Unspecified pre-existing hypertension complicating pregnancy, unspecified trimester: Secondary | ICD-10-CM

## 2024-11-15 DIAGNOSIS — O24113 Pre-existing diabetes mellitus, type 2, in pregnancy, third trimester: Secondary | ICD-10-CM | POA: Diagnosis not present

## 2024-11-15 DIAGNOSIS — Z3A33 33 weeks gestation of pregnancy: Secondary | ICD-10-CM | POA: Diagnosis not present

## 2024-11-15 DIAGNOSIS — Z6841 Body Mass Index (BMI) 40.0 and over, adult: Secondary | ICD-10-CM

## 2024-11-15 DIAGNOSIS — O10913 Unspecified pre-existing hypertension complicating pregnancy, third trimester: Secondary | ICD-10-CM | POA: Diagnosis not present

## 2024-11-15 DIAGNOSIS — Z1389 Encounter for screening for other disorder: Secondary | ICD-10-CM

## 2024-11-15 DIAGNOSIS — E119 Type 2 diabetes mellitus without complications: Secondary | ICD-10-CM

## 2024-11-15 DIAGNOSIS — Z331 Pregnant state, incidental: Secondary | ICD-10-CM

## 2024-11-15 LAB — POCT URINALYSIS DIPSTICK OB
Blood, UA: NEGATIVE
Ketones, UA: NEGATIVE
Leukocytes, UA: NEGATIVE
Nitrite, UA: NEGATIVE

## 2024-11-15 LAB — URINE CULTURE

## 2024-11-15 NOTE — Progress Notes (Signed)
" ° °  NURSE VISIT- NST  SUBJECTIVE:  Candice Campos is a 33 y.o. [phone] female at [redacted]w[redacted]d, here for a NST for pregnancy complicated by [facility] and Diabetes: T2DM.  She reports active fetal movement, contractions: none, vaginal bleeding: none, membranes: intact.   OBJECTIVE:  BP 130/85   Pulse 81   Wt 282 lb (127.9 kg)   LMP  (LMP Unknown)   BMI 45.52 kg/m   Appears well, no apparent distress  Results for orders placed or performed in visit on 11/15/24 (from the past 24 hours)  POC Urinalysis Dipstick OB   Collection Time: 11/15/24  9:39 AM  Result Value Ref Range   Color, UA     Clarity, UA     Glucose, UA Trace (A) Negative   Bilirubin, UA     Ketones, UA negative    Spec Grav, UA     Blood, UA negative    pH, UA     POC,PROTEIN,UA Small (1+) Negative, Trace, Small (1+), Moderate (2+), Large (3+), 4+   Urobilinogen, UA     Nitrite, UA negative    Leukocytes, UA Negative Negative   Appearance     Odor      NST: FHR baseline 140 bpm, Variability: moderate, Accelerations:present, Decelerations:  Absent= Cat 1 /reactive Toco: none   ASSESSMENT: [id number] at [redacted]w[redacted]d with CHTN and Diabetes: T2DM NST reactive  PLAN: EFM strip reviewed by Dr. Jayne   Recommendations: keep next appointment as scheduled    Clarita Salt  11/15/2024 10:09 AM  "

## 2024-11-19 ENCOUNTER — Encounter: Payer: Self-pay | Admitting: Women's Health

## 2024-11-19 ENCOUNTER — Ambulatory Visit (INDEPENDENT_AMBULATORY_CARE_PROVIDER_SITE_OTHER): Admitting: Women's Health

## 2024-11-19 ENCOUNTER — Ambulatory Visit (INDEPENDENT_AMBULATORY_CARE_PROVIDER_SITE_OTHER): Admitting: Radiology

## 2024-11-19 VITALS — BP 142/91 | HR 103 | Wt 281.2 lb

## 2024-11-19 DIAGNOSIS — O10913 Unspecified pre-existing hypertension complicating pregnancy, third trimester: Secondary | ICD-10-CM

## 2024-11-19 DIAGNOSIS — Z3A34 34 weeks gestation of pregnancy: Secondary | ICD-10-CM | POA: Diagnosis not present

## 2024-11-19 DIAGNOSIS — O24913 Unspecified diabetes mellitus in pregnancy, third trimester: Secondary | ICD-10-CM

## 2024-11-19 DIAGNOSIS — O0990 Supervision of high risk pregnancy, unspecified, unspecified trimester: Secondary | ICD-10-CM

## 2024-11-19 DIAGNOSIS — Z86711 Personal history of pulmonary embolism: Secondary | ICD-10-CM

## 2024-11-19 DIAGNOSIS — E119 Type 2 diabetes mellitus without complications: Secondary | ICD-10-CM | POA: Diagnosis not present

## 2024-11-19 DIAGNOSIS — O113 Pre-existing hypertension with pre-eclampsia, third trimester: Secondary | ICD-10-CM

## 2024-11-19 DIAGNOSIS — O09893 Supervision of other high risk pregnancies, third trimester: Secondary | ICD-10-CM | POA: Diagnosis not present

## 2024-11-19 DIAGNOSIS — O34219 Maternal care for unspecified type scar from previous cesarean delivery: Secondary | ICD-10-CM | POA: Diagnosis not present

## 2024-11-19 DIAGNOSIS — O10919 Unspecified pre-existing hypertension complicating pregnancy, unspecified trimester: Secondary | ICD-10-CM

## 2024-11-19 DIAGNOSIS — O1493 Unspecified pre-eclampsia, third trimester: Secondary | ICD-10-CM

## 2024-11-19 DIAGNOSIS — Z7984 Long term (current) use of oral hypoglycemic drugs: Secondary | ICD-10-CM | POA: Diagnosis not present

## 2024-11-19 DIAGNOSIS — O24415 Gestational diabetes mellitus in pregnancy, controlled by oral hypoglycemic drugs: Secondary | ICD-10-CM

## 2024-11-19 DIAGNOSIS — O24113 Pre-existing diabetes mellitus, type 2, in pregnancy, third trimester: Secondary | ICD-10-CM

## 2024-11-19 DIAGNOSIS — O99019 Anemia complicating pregnancy, unspecified trimester: Secondary | ICD-10-CM

## 2024-11-19 DIAGNOSIS — Z98891 History of uterine scar from previous surgery: Secondary | ICD-10-CM

## 2024-11-19 DIAGNOSIS — O0993 Supervision of high risk pregnancy, unspecified, third trimester: Secondary | ICD-10-CM

## 2024-11-19 DIAGNOSIS — Z6841 Body Mass Index (BMI) 40.0 and over, adult: Secondary | ICD-10-CM

## 2024-11-19 DIAGNOSIS — I10 Essential (primary) hypertension: Secondary | ICD-10-CM

## 2024-11-19 LAB — POCT URINALYSIS DIPSTICK OB
Blood, UA: NEGATIVE
Ketones, UA: NEGATIVE
Leukocytes, UA: NEGATIVE
Nitrite, UA: NEGATIVE

## 2024-11-19 NOTE — Progress Notes (Signed)
 "  HIGH-RISK PREGNANCY VISIT Patient name: Candice Campos MRN [id number]  Date of birth: 09/05/1991 Chief Complaint:   Routine Prenatal Visit  History of Present Illness:   Candice Campos is a 33 y.o. [id number] female at [redacted]w[redacted]d with an Estimated Date of Delivery: 12/31/24 being seen today for ongoing management of a high-risk pregnancy complicated by [facility] w/ superimposed pre-e w/o severe features on nifedipine  30mg  daily, T2DM on metformin  500AM/1000PM, PGBMI 45.    Today she reports all sugars wnl. Mild headache last few days, but thinks it is her sinuses. Took apap yesterday and it went away. No visual changes, ruq/epigastric pain, n/v. Home bp's 130s/80s, last night was 140s/80s.  Contractions: Not present. Vag. Bleeding: None.  Movement: Present. denies leaking of fluid.      06/12/2024    9:46 AM  Depression screen PHQ 2/9  Decreased Interest 0  Down, Depressed, Hopeless 0  PHQ - 2 Score 0  Altered sleeping 0  Tired, decreased energy 0  Change in appetite 0  Feeling bad or failure about yourself  0  Trouble concentrating 0  Moving slowly or fidgety/restless 0  Suicidal thoughts 0  PHQ-9 Score 0      Data saved with a previous flowsheet row definition        06/12/2024    9:46 AM  GAD 7 : Generalized Anxiety Score  Nervous, Anxious, on Edge 0  Control/stop worrying 0  Worry too much - different things 0  Trouble relaxing 0  Restless 0  Easily annoyed or irritable 0  Afraid - awful might happen 0  Total GAD 7 Score 0     Review of Systems:   Pertinent items are noted in HPI Denies abnormal vaginal discharge w/ itching/odor/irritation, headaches, visual changes, shortness of breath, chest pain, abdominal pain, severe nausea/vomiting, or problems with urination or bowel movements unless otherwise stated above. Pertinent History Reviewed:  Reviewed past medical,surgical, social, obstetrical and family history.  Reviewed problem list, medications and allergies. Physical  Assessment:   Vitals:   11/19/24 0907 11/19/24 0948  BP: (!) 138/91 (!) 142/91  Pulse: (!) 103   Weight: 281 lb 3.2 oz (127.6 kg)   Body mass index is 45.39 kg/m.           Physical Examination:   General appearance: alert, well appearing, and in no distress  Mental status: alert, oriented to person, place, and time  Skin: warm & dry   Extremities: Edema: None    Cardiovascular: normal heart rate noted  Respiratory: normal respiratory effort, no distress  Abdomen: gravid, soft, non-tender  Pelvic: Cervical exam deferred         Fetal Status:     Movement: Present    Fetal Surveillance Testing today: US : GA = 34 weeks Single active female fetus, cephalic, FHR = 146 bpm, anterior pl, gr 3, AFI = 20.6 cm, MVP = 6.1 cm, BPP = 8/8, RI: 0.64, 0.48  avg: 0.57  40%, (cord dopplers challenging due to fetal movements and respirations) good diastolic flow, cervix closed  Chaperone: N/A  Results for orders placed or performed in visit on 11/19/24 (from the past 24 hours)  POC Urinalysis Dipstick OB   Collection Time: 11/19/24  9:16 AM  Result Value Ref Range   Color, UA     Clarity, UA     Glucose, UA Moderate (2+) (A) Negative   Bilirubin, UA     Ketones, UA neg    Spec Grav, UA  Blood, UA neg    pH, UA     POC,PROTEIN,UA Small (1+) Negative, Trace, Small (1+), Moderate (2+), Large (3+), 4+   Urobilinogen, UA     Nitrite, UA neg    Leukocytes, UA Negative Negative   Appearance     Odor      Assessment & Plan:  High-risk pregnancy: H6E9888 at [redacted]w[redacted]d with an Estimated Date of Delivery: 12/31/24   1) CHTN w/ superimposed pre-e w/o severe features, h/o severe pre-e w/ 34w delivery last pregnancy. P:C ratio last week 506 (had 2+ on diptick, neg urine cx). Asymptomatic, 1+ proteinuria today. BPP 8/8, UAD 40%. Will get cbc/cmp today. Check bp's bid, if >160/110 or pre-e sx go to [facility]. Right now plan IOL 37w unless indicated earlier. Discussed w/ LHE, continue nifedipine  30mg  daily, no  change right now  2) T2DM, stable on metformin  500AM/1000PM, EFW 57% at 33w, normal AFI  3) Prev c/s> wants TOLAC, consent 11/14  4) PGBMI 45> currently 45  Meds: No orders of the defined types were placed in this encounter.   Labs/procedures today: U/S  Treatment Plan:  EFW q4w   2x/wk testing   Deliver at 37.0   Reviewed: Preterm labor symptoms and general obstetric precautions including but not limited to vaginal bleeding, contractions, leaking of fluid and fetal movement were reviewed in detail with the patient.  All questions were answered. Does have home bp cuff. Office bp cuff given: not applicable. Check bp twice daily  Follow-up: Return for As scheduled.   Future Appointments  Date Time Provider Department Center  11/26/2024  9:15 AM [facility] - [facility] US  [facility] None  11/26/2024 10:10 AM Kizzie Suzen SAUNDERS, CNM [facility]  11/29/2024  9:50 AM [facility] NURSE [facility]  12/03/2024  8:30 AM [facility] - FT IMG 2 [facility] None  12/03/2024  9:30 AM Jayne Vonn DEL, MD [facility]  12/06/2024  9:10 AM [facility] NURSE [facility]  12/10/2024  8:30 AM [facility] - FTO[facility]YN US  [facility] None  12/10/2024  9:30 AM Ozan, Jennifer, DO [facility]  12/13/2024  9:10 AM [facility] NURSE [facility]  12/17/2024  8:30 AM [facility] - FT IMG 2 [facility] None  12/17/2024  9:30 AM Kizzie Suzen SAUNDERS, CNM [facility]  12/20/2024  9:10 AM [facility] NURSE [facility]  12/24/2024  8:30 AM [facility] - FTOBGYN US  [facility] None  12/24/2024  9:30 AM Marilynn Nest, DO [facility]  12/27/2024  9:10 AM [facility] NURSE [facility]  12/31/2024  8:30 AM [facility] FT IMG 2 [facility] None  12/31/2024  9:30 AM Eure, Vonn DEL, MD [facility]    Orders Placed This Encounter  Procedures   CBC   Comprehensive metabolic panel with GFR   POC Urinalysis Dipstick OB   Suzen SAUNDERS Kizzie CNM, [facility] 11/19/2024 9:56 AM  "

## 2024-11-19 NOTE — Progress Notes (Signed)
 US : GA = 34 weeks Single active female fetus, cephalic, FHR = 146 bpm, anterior pl, gr 3, AFI = 20.6 cm, MVP = 6.1 cm, BPP = 8/8, RI: 0.64, 0.48  avg: 0.57  40%,  cervix closed

## 2024-11-19 NOTE — Patient Instructions (Signed)
 Candice Campos, thank you for choosing our office today! We appreciate the opportunity to meet your healthcare needs. You may receive a short survey by mail, e-mail, or through [employer]. If you are happy with your care we would appreciate if you could take just a few minutes to complete the survey questions. We read all of your comments and take your feedback very seriously. Thank you again for choosing our office.  Center for [employer] Team at [facility]  [facility] at [facility] ([address] [place], [place] [address]) Entrance C, located off of [address] Free 24/7 valet parking   CLASSES: Go to [email or web address] to register for classes (childbirth, breastfeeding, waterbirth, infant CPR, daddy bootcamp, etc.)  Call the office [phone]) or go to [facility] if: You begin to have strong, frequent contractions Your water breaks.  Sometimes it is a big gush of fluid, sometimes it is just a trickle that keeps getting your panties wet or running down your legs You have vaginal bleeding.  It is normal to have a small amount of spotting if your cervix was checked.  You don't feel your baby moving like normal.  If you don't, get you something to eat and drink and lay down and focus on feeling your baby move.   If your baby is still not moving like normal, you should call the office or go to [facility]  Call the office [phone]) or go to [facility] hospital for these signs of pre-eclampsia: Severe headache that does not go away with Tylenol  Visual changes- seeing spots, double, blurred vision Pain under your right breast or upper abdomen that does not go away with Tums or heartburn medicine Nausea and/or vomiting Severe swelling in your hands, feet, and face   Tdap Vaccine It is recommended that you get the Tdap vaccine during the third trimester of EACH pregnancy to help protect your baby from getting pertussis (whooping cough) 27-36 weeks is the BEST time to do  this so that you can pass the protection on to your baby. During pregnancy is better than after pregnancy, but if you are unable to get it during pregnancy it will be offered at the hospital.  You can get this vaccine with us , at the health department, your family doctor, or some local pharmacies Everyone who will be around your baby should also be up-to-date on their vaccines before the baby comes. Adults (who are not pregnant) only need 1 dose of Tdap during adulthood.   [facility] Pediatricians/Family Doctors [facility]): [address] [place], [phone]           [facility]: [address], [phone]                [facility]): [address], [phone] (call to ask if accepting patients) [facility]: [address], [place], [phone]    [facility] Pediatricians/Family Doctors [facility]): [phone] [address], [phone] [facility]: [address] [place], [phone] [facility]: [address], [phone]  [facility] Doctors  [facility]): [phone] [facility]: [address] [phone]   [facility] Doctors [facility]: [address], [phone]  [facility] Family Doctors  [facility]: [phone], [phone]  Home Blood Pressure Monitoring for Patients   Your provider has recommended that you check your  blood pressure (BP) at least once a week at home. If you do not have a blood pressure cuff at home, one will be provided for you. Contact your provider if you have not received your monitor within 1 week.   Helpful Tips for Accurate Home Blood Pressure Checks  Don't smoke, exercise, or drink caffeine 30 minutes before checking your BP Use the restroom before checking your BP (a full bladder can raise your  pressure) Relax in a comfortable upright chair Feet on the ground Left arm resting comfortably on a flat surface at the level of your heart Legs uncrossed Back supported Sit quietly and don't talk Place the cuff on your bare arm Adjust snuggly, so that only two fingertips can fit between your skin and the top of the cuff Check 2 readings separated by at least one minute Keep a log of your BP readings For a visual, please reference this diagram: http://ccnc.care/bpdiagram  Provider Name: [facility]     Phone: [phone]  Zone 1: ALL CLEAR  Continue to monitor your symptoms:  BP reading is less than 140 (top number) or less than 90 (bottom number)  No right upper stomach pain No headaches or seeing spots No feeling nauseated or throwing up No swelling in face and hands  Zone 2: CAUTION Call your doctor's office for any of the following:  BP reading is greater than 140 (top number) or greater than 90 (bottom number)  Stomach pain under your ribs in the middle or right side Headaches or seeing spots Feeling nauseated or throwing up Swelling in face and hands  Zone 3: EMERGENCY  Seek immediate medical care if you have any of the following:  BP reading is greater than160 (top number) or greater than 110 (bottom number) Severe headaches not improving with Tylenol  Serious difficulty catching your breath Any worsening symptoms from Zone 2  Preterm Labor and Birth Information  The normal length of a pregnancy is 39-41 weeks. Preterm labor is when labor starts before 37 completed weeks of pregnancy. What are the risk factors for preterm labor? Preterm labor is more likely to occur in women who: Have certain infections during pregnancy such as a bladder infection, sexually transmitted infection, or infection inside the uterus (chorioamnionitis). Have a shorter-than-normal cervix. Have gone into preterm labor before. Have had surgery on their cervix. Are younger than age 24  or older than age 12. Are African American. Are pregnant with twins or multiple babies (multiple gestation). Take street drugs or smoke while pregnant. Do not gain enough weight while pregnant. Became pregnant shortly after having been pregnant. What are the symptoms of preterm labor? Symptoms of preterm labor include: Cramps similar to those that can happen during a menstrual period. The cramps may happen with diarrhea. Pain in the abdomen or lower back. Regular uterine contractions that may feel like tightening of the abdomen. A feeling of increased pressure in the pelvis. Increased watery or bloody mucus discharge from the vagina. Water breaking (ruptured amniotic sac). Why is it important to recognize signs of preterm labor? It is important to recognize signs of preterm labor because babies who are born prematurely may not be fully developed. This can put them at an increased risk for: Long-term (chronic) heart and lung problems. Difficulty immediately after birth with regulating body systems, including blood sugar, body temperature, heart rate, and breathing rate. Bleeding in the brain. Cerebral palsy. Learning difficulties. Death. These risks are highest for babies who are born before 34 weeks  of pregnancy. How is preterm labor treated? Treatment depends on the length of your pregnancy, your condition, and the health of your baby. It may involve: Having a stitch (suture) placed in your cervix to prevent your cervix from opening too early (cerclage). Taking or being given medicines, such as: Hormone medicines. These may be given early in pregnancy to help support the pregnancy. Medicine to stop contractions. Medicines to help mature the babys lungs. These may be prescribed if the risk of delivery is high. Medicines to prevent your baby from developing cerebral palsy. If the labor happens before 34 weeks of pregnancy, you may need to stay in the hospital. What should I do if I  think I am in preterm labor? If you think that you are going into preterm labor, call your health care provider right away. How can I prevent preterm labor in future pregnancies? To increase your chance of having a full-term pregnancy: Do not use any tobacco products, such as cigarettes, chewing tobacco, and e-cigarettes. If you need help quitting, ask your health care provider. Do not use street drugs or medicines that have not been prescribed to you during your pregnancy. Talk with your health care provider before taking any herbal supplements, even if you have been taking them regularly. Make sure you gain a healthy amount of weight during your pregnancy. Watch for infection. If you think that you might have an infection, get it checked right away. Make sure to tell your health care provider if you have gone into preterm labor before. This information is not intended to replace advice given to you by your health care provider. Make sure you discuss any questions you have with your health care provider. Document Revised: 03/08/2019 Document Reviewed: 04/06/2016 Elsevier Patient Education  2020 [employer].

## 2024-11-20 LAB — CBC
Hematocrit: 32.5 % — ABNORMAL LOW (ref 34.0–46.6)
Hemoglobin: 10 g/dL — ABNORMAL LOW (ref 11.1–15.9)
MCH: 23.3 pg — ABNORMAL LOW (ref 26.6–33.0)
MCHC: 30.8 g/dL — ABNORMAL LOW (ref 31.5–35.7)
MCV: 76 fL — ABNORMAL LOW (ref 79–97)
Platelets: 227 x10E3/uL (ref 150–450)
RBC: 4.3 x10E6/uL (ref 3.77–5.28)
RDW: 17.2 % — ABNORMAL HIGH (ref 11.7–15.4)
WBC: 6.9 x10E3/uL (ref 3.4–10.8)

## 2024-11-20 LAB — COMPREHENSIVE METABOLIC PANEL WITH GFR
ALT: 7 IU/L (ref 0–32)
AST: 10 IU/L (ref 0–40)
Albumin: 3.4 g/dL — ABNORMAL LOW (ref 3.9–4.9)
Alkaline Phosphatase: 114 IU/L (ref 41–116)
BUN/Creatinine Ratio: 13 (ref 9–23)
BUN: 7 mg/dL (ref 6–20)
Bilirubin Total: <0.2 mg/dL (ref 0.0–1.2)
CO2: 17 mmol/L — ABNORMAL LOW (ref 20–29)
Calcium: 9.1 mg/dL (ref 8.7–10.2)
Chloride: 105 mmol/L (ref 96–106)
Creatinine, Ser: 0.56 mg/dL — ABNORMAL LOW (ref 0.57–1.00)
Globulin, Total: 2.5 g/dL (ref 1.5–4.5)
Glucose: 141 mg/dL — ABNORMAL HIGH (ref 70–99)
Potassium: 4.1 mmol/L (ref 3.5–5.2)
Sodium: 137 mmol/L (ref 134–144)
Total Protein: 5.9 g/dL — ABNORMAL LOW (ref 6.0–8.5)
eGFR: 124 mL/min/1.73

## 2024-11-25 ENCOUNTER — Other Ambulatory Visit: Payer: Self-pay | Admitting: Obstetrics & Gynecology

## 2024-11-25 ENCOUNTER — Ambulatory Visit: Admitting: *Deleted

## 2024-11-25 VITALS — BP 149/96 | HR 105

## 2024-11-25 DIAGNOSIS — O113 Pre-existing hypertension with pre-eclampsia, third trimester: Secondary | ICD-10-CM

## 2024-11-25 DIAGNOSIS — O24913 Unspecified diabetes mellitus in pregnancy, third trimester: Secondary | ICD-10-CM

## 2024-11-25 DIAGNOSIS — O0990 Supervision of high risk pregnancy, unspecified, unspecified trimester: Secondary | ICD-10-CM

## 2024-11-25 DIAGNOSIS — Z6841 Body Mass Index (BMI) 40.0 and over, adult: Secondary | ICD-10-CM

## 2024-11-25 DIAGNOSIS — O99019 Anemia complicating pregnancy, unspecified trimester: Secondary | ICD-10-CM

## 2024-11-25 DIAGNOSIS — Z98891 History of uterine scar from previous surgery: Secondary | ICD-10-CM

## 2024-11-25 DIAGNOSIS — Z3A34 34 weeks gestation of pregnancy: Secondary | ICD-10-CM

## 2024-11-25 DIAGNOSIS — O10919 Unspecified pre-existing hypertension complicating pregnancy, unspecified trimester: Secondary | ICD-10-CM

## 2024-11-25 DIAGNOSIS — O34219 Maternal care for unspecified type scar from previous cesarean delivery: Secondary | ICD-10-CM

## 2024-11-25 DIAGNOSIS — Z3A36 36 weeks gestation of pregnancy: Secondary | ICD-10-CM

## 2024-11-25 DIAGNOSIS — I10 Essential (primary) hypertension: Secondary | ICD-10-CM

## 2024-11-25 LAB — POCT URINALYSIS DIPSTICK OB
Blood, UA: NEGATIVE
Leukocytes, UA: NEGATIVE
Nitrite, UA: NEGATIVE

## 2024-11-25 NOTE — Progress Notes (Signed)
" ° °  NURSE VISIT- BLOOD PRESSURE CHECK  SUBJECTIVE:  Candice Campos is a 33 y.o. [phone] female here for BP check. She is [redacted]w[redacted]d pregnant    HYPERTENSION ROS:  Pregnant/postpartum:  Severe headaches that don't go away with tylenol /other medicines: headaches becoming more frequent Visual changes (seeing spots/double/blurred vision) No  Severe pain under right breast breast or in center of upper chest No  Severe nausea/vomiting No  Taking medicines as instructed yes   OBJECTIVE:  BP (!) 149/96 (BP Location: Right Arm, Patient Position: Sitting, Cuff Size: Large)   Pulse (!) 105   LMP  (LMP Unknown)   Appearance appears tired.  ASSESSMENT: Pregnancy [redacted]w[redacted]d  blood pressure check  PLAN: Discussed with Luke Fetters, CNM, [facility]   Recommendations: check pre-e labs today   Follow-up: as scheduled S/S discussed and advised to check BP a couple of times later today. If readings >160/110, go to MAU   Rutherford Rover  11/25/2024 11:40 AM  "

## 2024-11-26 ENCOUNTER — Encounter: Payer: Self-pay | Admitting: Women's Health

## 2024-11-26 ENCOUNTER — Ambulatory Visit

## 2024-11-26 ENCOUNTER — Ambulatory Visit: Admitting: Women's Health

## 2024-11-26 ENCOUNTER — Inpatient Hospital Stay (HOSPITAL_COMMUNITY)
Admission: AD | Admit: 2024-11-26 | Discharge: 2024-11-26 | Disposition: A | Discharge status: Home / Self Care | Attending: Obstetrics & Gynecology | Admitting: Obstetrics & Gynecology

## 2024-11-26 VITALS — BP 139/89 | HR 83 | Wt 286.0 lb

## 2024-11-26 DIAGNOSIS — Z6841 Body Mass Index (BMI) 40.0 and over, adult: Secondary | ICD-10-CM

## 2024-11-26 DIAGNOSIS — I10 Essential (primary) hypertension: Secondary | ICD-10-CM

## 2024-11-26 DIAGNOSIS — O1493 Unspecified pre-eclampsia, third trimester: Secondary | ICD-10-CM

## 2024-11-26 DIAGNOSIS — O34219 Maternal care for unspecified type scar from previous cesarean delivery: Secondary | ICD-10-CM | POA: Diagnosis not present

## 2024-11-26 DIAGNOSIS — O99019 Anemia complicating pregnancy, unspecified trimester: Secondary | ICD-10-CM

## 2024-11-26 DIAGNOSIS — Z8759 Personal history of other complications of pregnancy, childbirth and the puerperium: Secondary | ICD-10-CM | POA: Diagnosis not present

## 2024-11-26 DIAGNOSIS — E119 Type 2 diabetes mellitus without complications: Secondary | ICD-10-CM

## 2024-11-26 DIAGNOSIS — O10919 Unspecified pre-existing hypertension complicating pregnancy, unspecified trimester: Secondary | ICD-10-CM

## 2024-11-26 DIAGNOSIS — R519 Headache, unspecified: Secondary | ICD-10-CM | POA: Diagnosis present

## 2024-11-26 DIAGNOSIS — O113 Pre-existing hypertension with pre-eclampsia, third trimester: Secondary | ICD-10-CM

## 2024-11-26 DIAGNOSIS — Z7984 Long term (current) use of oral hypoglycemic drugs: Secondary | ICD-10-CM

## 2024-11-26 DIAGNOSIS — Z3A34 34 weeks gestation of pregnancy: Secondary | ICD-10-CM

## 2024-11-26 DIAGNOSIS — O24913 Unspecified diabetes mellitus in pregnancy, third trimester: Secondary | ICD-10-CM | POA: Diagnosis not present

## 2024-11-26 DIAGNOSIS — O24113 Pre-existing diabetes mellitus, type 2, in pregnancy, third trimester: Secondary | ICD-10-CM

## 2024-11-26 DIAGNOSIS — Z3A35 35 weeks gestation of pregnancy: Secondary | ICD-10-CM

## 2024-11-26 DIAGNOSIS — O10013 Pre-existing essential hypertension complicating pregnancy, third trimester: Secondary | ICD-10-CM | POA: Insufficient documentation

## 2024-11-26 DIAGNOSIS — Z98891 History of uterine scar from previous surgery: Secondary | ICD-10-CM

## 2024-11-26 DIAGNOSIS — O0990 Supervision of high risk pregnancy, unspecified, unspecified trimester: Secondary | ICD-10-CM

## 2024-11-26 DIAGNOSIS — R079 Chest pain, unspecified: Secondary | ICD-10-CM | POA: Diagnosis present

## 2024-11-26 DIAGNOSIS — Z3A36 36 weeks gestation of pregnancy: Secondary | ICD-10-CM

## 2024-11-26 DIAGNOSIS — O0993 Supervision of high risk pregnancy, unspecified, third trimester: Secondary | ICD-10-CM

## 2024-11-26 DIAGNOSIS — O10913 Unspecified pre-existing hypertension complicating pregnancy, third trimester: Secondary | ICD-10-CM | POA: Diagnosis not present

## 2024-11-26 DIAGNOSIS — R0602 Shortness of breath: Secondary | ICD-10-CM | POA: Diagnosis present

## 2024-11-26 LAB — CBC
HCT: 31.4 % — ABNORMAL LOW (ref 36.0–46.0)
Hematocrit: 31.9 % — ABNORMAL LOW (ref 34.0–46.6)
Hemoglobin: 10 g/dL — ABNORMAL LOW (ref 11.1–15.9)
Hemoglobin: 9.6 g/dL — ABNORMAL LOW (ref 12.0–15.0)
MCH: 23.4 pg — ABNORMAL LOW (ref 26.6–33.0)
MCH: 23 pg — ABNORMAL LOW (ref 26.0–34.0)
MCHC: 31.3 g/dL — ABNORMAL LOW (ref 31.5–35.7)
MCHC: 30.6 g/dL (ref 30.0–36.0)
MCV: 75 fL — ABNORMAL LOW (ref 79–97)
MCV: 75.3 fL — ABNORMAL LOW (ref 80.0–100.0)
Platelets: 205 x10E3/uL (ref 150–450)
Platelets: 214 K/uL (ref 150–400)
RBC: 4.28 x10E6/uL (ref 3.77–5.28)
RBC: 4.17 MIL/uL (ref 3.87–5.11)
RDW: 17.1 % — ABNORMAL HIGH (ref 11.7–15.4)
RDW: 17.1 % — ABNORMAL HIGH (ref 11.5–15.5)
WBC: 5 x10E3/uL (ref 3.4–10.8)
WBC: 6.7 K/uL (ref 4.0–10.5)
nRBC: 0 % (ref 0.0–0.2)

## 2024-11-26 LAB — POCT URINALYSIS DIPSTICK OB
Blood, UA: NEGATIVE
Glucose, UA: NEGATIVE
Ketones, UA: NEGATIVE
Leukocytes, UA: NEGATIVE
Nitrite, UA: NEGATIVE

## 2024-11-26 LAB — COMPREHENSIVE METABOLIC PANEL WITH GFR
ALT: 9 IU/L (ref 0–32)
ALT: 7 U/L (ref 0–44)
AST: 8 IU/L (ref 0–40)
AST: 15 U/L (ref 15–41)
Albumin: 3.2 g/dL — ABNORMAL LOW (ref 3.9–4.9)
Albumin: 3.4 g/dL — ABNORMAL LOW (ref 3.5–5.0)
Alkaline Phosphatase: 121 IU/L — ABNORMAL HIGH (ref 41–116)
Alkaline Phosphatase: 121 U/L (ref 38–126)
Anion gap: 11 (ref 5–15)
BUN/Creatinine Ratio: 18 (ref 9–23)
BUN: 10 mg/dL (ref 6–20)
BUN: 12 mg/dL (ref 6–20)
Bilirubin Total: <0.2 mg/dL (ref 0.0–1.2)
CO2: 18 mmol/L — ABNORMAL LOW (ref 20–29)
CO2: 19 mmol/L — ABNORMAL LOW (ref 22–32)
Calcium: 9.1 mg/dL (ref 8.7–10.2)
Calcium: 8.8 mg/dL — ABNORMAL LOW (ref 8.9–10.3)
Chloride: 104 mmol/L (ref 96–106)
Chloride: 107 mmol/L (ref 98–111)
Creatinine, Ser: 0.56 mg/dL — ABNORMAL LOW (ref 0.57–1.00)
Creatinine, Ser: 0.56 mg/dL (ref 0.44–1.00)
GFR, Estimated: >60 mL/min
Globulin, Total: 2.6 g/dL (ref 1.5–4.5)
Glucose, Bld: 126 mg/dL — ABNORMAL HIGH (ref 70–99)
Glucose: 133 mg/dL — ABNORMAL HIGH (ref 70–99)
Potassium: 4 mmol/L (ref 3.5–5.2)
Potassium: 4.1 mmol/L (ref 3.5–5.1)
Sodium: 138 mmol/L (ref 134–144)
Sodium: 137 mmol/L (ref 135–145)
Total Bilirubin: <0.2 mg/dL (ref 0.0–1.2)
Total Protein: 5.8 g/dL — ABNORMAL LOW (ref 6.0–8.5)
Total Protein: 6.2 g/dL — ABNORMAL LOW (ref 6.5–8.1)
eGFR: 124 mL/min/1.73

## 2024-11-26 MED ORDER — ACETAMINOPHEN 500 MG PO TABS
1000.0000 mg | ORAL_TABLET | Freq: Once | ORAL | Status: AC
  Administered 2024-11-26: 1000 mg via ORAL
  Filled 2024-11-26: qty 2

## 2024-11-26 NOTE — Progress Notes (Signed)
 US  35 wks,cephalic,AFI 24 cm,BPP 8/8,FHR 130 bpm,RI .59,.59,.67=66%,anterior placenta gr 3

## 2024-11-26 NOTE — Patient Instructions (Signed)
 Candice Campos, thank you for choosing our office today! We appreciate the opportunity to meet your healthcare needs. You may receive a short survey by mail, e-mail, or through [employer]. If you are happy with your care we would appreciate if you could take just a few minutes to complete the survey questions. We read all of your comments and take your feedback very seriously. Thank you again for choosing our office.  Center for [employer] Team at [facility]  [facility] at [facility] ([address] [place], [place] [address]) Entrance C, located off of [address] Free 24/7 valet parking   CLASSES: Go to [email or web address] to register for classes (childbirth, breastfeeding, waterbirth, infant CPR, daddy bootcamp, etc.)  Call the office [phone]) or go to [facility] if: You begin to have strong, frequent contractions Your water breaks.  Sometimes it is a big gush of fluid, sometimes it is just a trickle that keeps getting your panties wet or running down your legs You have vaginal bleeding.  It is normal to have a small amount of spotting if your cervix was checked.  You don't feel your baby moving like normal.  If you don't, get you something to eat and drink and lay down and focus on feeling your baby move.   If your baby is still not moving like normal, you should call the office or go to [facility]  Call the office [phone]) or go to [facility] hospital for these signs of pre-eclampsia: Severe headache that does not go away with Tylenol  Visual changes- seeing spots, double, blurred vision Pain under your right breast or upper abdomen that does not go away with Tums or heartburn medicine Nausea and/or vomiting Severe swelling in your hands, feet, and face   Tdap Vaccine It is recommended that you get the Tdap vaccine during the third trimester of EACH pregnancy to help protect your baby from getting pertussis (whooping cough) 27-36 weeks is the BEST time to do  this so that you can pass the protection on to your baby. During pregnancy is better than after pregnancy, but if you are unable to get it during pregnancy it will be offered at the hospital.  You can get this vaccine with us , at the health department, your family doctor, or some local pharmacies Everyone who will be around your baby should also be up-to-date on their vaccines before the baby comes. Adults (who are not pregnant) only need 1 dose of Tdap during adulthood.   [facility] Pediatricians/Family Doctors [facility]): [address] [place], [phone]           [facility]: [address], [phone]                [facility]): [address], [phone] (call to ask if accepting patients) [facility]: [address], [place], [phone]    [facility] Pediatricians/Family Doctors [facility]): [phone] [address], [phone] [facility]: [address] [place], [phone] [facility]: [address], [phone]  [facility] Doctors  [facility]): [phone] [facility]: [address] [phone]   [facility] Doctors [facility]: [address], [phone]  [facility] Family Doctors  [facility]: [phone], [phone]  Home Blood Pressure Monitoring for Patients   Your provider has recommended that you check your  blood pressure (BP) at least once a week at home. If you do not have a blood pressure cuff at home, one will be provided for you. Contact your provider if you have not received your monitor within 1 week.   Helpful Tips for Accurate Home Blood Pressure Checks  Don't smoke, exercise, or drink caffeine 30 minutes before checking your BP Use the restroom before checking your BP (a full bladder can raise your  pressure) Relax in a comfortable upright chair Feet on the ground Left arm resting comfortably on a flat surface at the level of your heart Legs uncrossed Back supported Sit quietly and don't talk Place the cuff on your bare arm Adjust snuggly, so that only two fingertips can fit between your skin and the top of the cuff Check 2 readings separated by at least one minute Keep a log of your BP readings For a visual, please reference this diagram: http://ccnc.care/bpdiagram  Provider Name: [facility]     Phone: [phone]  Zone 1: ALL CLEAR  Continue to monitor your symptoms:  BP reading is less than 140 (top number) or less than 90 (bottom number)  No right upper stomach pain No headaches or seeing spots No feeling nauseated or throwing up No swelling in face and hands  Zone 2: CAUTION Call your doctor's office for any of the following:  BP reading is greater than 140 (top number) or greater than 90 (bottom number)  Stomach pain under your ribs in the middle or right side Headaches or seeing spots Feeling nauseated or throwing up Swelling in face and hands  Zone 3: EMERGENCY  Seek immediate medical care if you have any of the following:  BP reading is greater than160 (top number) or greater than 110 (bottom number) Severe headaches not improving with Tylenol  Serious difficulty catching your breath Any worsening symptoms from Zone 2  Preterm Labor and Birth Information  The normal length of a pregnancy is 39-41 weeks. Preterm labor is when labor starts before 37 completed weeks of pregnancy. What are the risk factors for preterm labor? Preterm labor is more likely to occur in women who: Have certain infections during pregnancy such as a bladder infection, sexually transmitted infection, or infection inside the uterus (chorioamnionitis). Have a shorter-than-normal cervix. Have gone into preterm labor before. Have had surgery on their cervix. Are younger than age 24  or older than age 12. Are African American. Are pregnant with twins or multiple babies (multiple gestation). Take street drugs or smoke while pregnant. Do not gain enough weight while pregnant. Became pregnant shortly after having been pregnant. What are the symptoms of preterm labor? Symptoms of preterm labor include: Cramps similar to those that can happen during a menstrual period. The cramps may happen with diarrhea. Pain in the abdomen or lower back. Regular uterine contractions that may feel like tightening of the abdomen. A feeling of increased pressure in the pelvis. Increased watery or bloody mucus discharge from the vagina. Water breaking (ruptured amniotic sac). Why is it important to recognize signs of preterm labor? It is important to recognize signs of preterm labor because babies who are born prematurely may not be fully developed. This can put them at an increased risk for: Long-term (chronic) heart and lung problems. Difficulty immediately after birth with regulating body systems, including blood sugar, body temperature, heart rate, and breathing rate. Bleeding in the brain. Cerebral palsy. Learning difficulties. Death. These risks are highest for babies who are born before 34 weeks  of pregnancy. How is preterm labor treated? Treatment depends on the length of your pregnancy, your condition, and the health of your baby. It may involve: Having a stitch (suture) placed in your cervix to prevent your cervix from opening too early (cerclage). Taking or being given medicines, such as: Hormone medicines. These may be given early in pregnancy to help support the pregnancy. Medicine to stop contractions. Medicines to help mature the babys lungs. These may be prescribed if the risk of delivery is high. Medicines to prevent your baby from developing cerebral palsy. If the labor happens before 34 weeks of pregnancy, you may need to stay in the hospital. What should I do if I  think I am in preterm labor? If you think that you are going into preterm labor, call your health care provider right away. How can I prevent preterm labor in future pregnancies? To increase your chance of having a full-term pregnancy: Do not use any tobacco products, such as cigarettes, chewing tobacco, and e-cigarettes. If you need help quitting, ask your health care provider. Do not use street drugs or medicines that have not been prescribed to you during your pregnancy. Talk with your health care provider before taking any herbal supplements, even if you have been taking them regularly. Make sure you gain a healthy amount of weight during your pregnancy. Watch for infection. If you think that you might have an infection, get it checked right away. Make sure to tell your health care provider if you have gone into preterm labor before. This information is not intended to replace advice given to you by your health care provider. Make sure you discuss any questions you have with your health care provider. Document Revised: 03/08/2019 Document Reviewed: 04/06/2016 Elsevier Patient Education  2020 [employer].

## 2024-11-26 NOTE — Discharge Instructions (Signed)
 Please go to the MAU (Maternity Assessment Unit) at [facility] Entrance C if you experience vaginal bleeding,leaking/gush of fluid like your water broke, notice decreased movement from your baby after doing kick counts, or contractions the are becoming more intense or more frequent.   Fetal Movement Counts When you're pregnant, you might start feeling your baby move around the middle of your pregnancy. At first, these movements might feel like flutters, rolls, or swishes. As your baby grows, you might feel more kicks and jabs. Around week 28 of your pregnancy, your health care team may ask you to count how often your baby moves. This is important for all pregnancies, but especially for high-risk ones. Counting movements can help lessen the risk of stillbirth.  What is a fetal movement count? A fetal movement count is the number of times that you feel your baby move during a certain amount of time. This may also be called a kick count. There are many ways to do a kick count. Ask your team what is best for you. Pay attention to when your baby is most active. You may notice your baby's sleep and wake cycles. You may also notice things that make your baby move more. When you do a kick count, try to do it: When your baby is normally most active. At the same time each day.  How do I count fetal movements? Find a quiet, comfortable area. Sit or lie down. Write down the date, the start time, and the number of movements you feel. Count kicks, flutters, swishes, rolls, and jabs. Usually, you will feel at least 10 movements within 2 hours. Stop counting after you have felt 10 movements or if you have been counting for 2 hours. Write down the stop time.  Contact a health care provider if:  You don't feel 10 movements in 2 hours. Your baby isn't moving as it usually does. Your baby isn't moving at all. If you're not able to reach your provider, go to an emergency room. This information is not  intended to replace advice given to you by your health care provider. Make sure you discuss any questions you have with your health care provider.  Document Revised: 12/08/2023 Document Reviewed: 11/30/2022 Elsevier Patient Education  2025 [employer].  Signs and Symptoms of Labor Labor is the body's natural process of moving the baby and the placenta out of the uterus. The process of labor usually starts when the baby is full-term, 37 weeks and 0 days or more.   As your body prepares for labor and the birth of your baby, you may notice the following symptoms in the weeks and days before true labor starts: Your baby dropping lower into your pelvis to get into position for birth (lightening). When this happens, you may feel more pressure on your bladder and pelvic bone and less pressure on your ribs. This may make it easier to breathe. It may also cause you to need to urinate more often and have problems with bowel movements. Having practice contractions, also called Braxton Hicks contractions or false labor. These occur at irregular (unevenly spaced) intervals that are more than 10 minutes apart. False labor contractions are common after exercise or sexual activity. They will stop if you change position, rest, or drink fluids. These contractions are usually mild and do not get stronger over time. They may feel like: A backache or back pain. Mild cramps, similar to menstrual cramps. Tightening or pressure in your abdomen. Passing a small  amount of thick, bloody mucus from your vagina. This is called normal bloody show or losing your mucus plug. This may happen more than a week before labor begins, or right before labor begins, as the opening of the cervix starts to widen (dilate). For some women, the entire mucus plug passes at once. For others, pieces of the mucus plug may gradually pass over several days.  Signs and symptoms that labor has begun Signs that you are in labor may  include: Having contractions that come at regular (evenly spaced) intervals and increase in intensity. This may feel like more intense tightening or pressure in your abdomen that moves to your back. Feeling pressure in the vaginal area. Your water breaking (called rupture of membranes). This is when the sac of fluid that surrounds your baby breaks. Fluid leaking from your vagina may be clear or blood-tinged. Labor usually starts within 24 hours of your water breaking, but it may take longer to begin. Some people may feel a sudden gush of fluid; others may notice repeatedly damp underwear.  Go to the hospital when  Your water breaks. Your labor has started: painful, regular contractions that are 5 minutes apart or less. You have a fever. You have bright red blood coming from your vagina. You do not feel your baby moving. You have a severe headache with or without vision problems. You have chest pain or shortness of breath. These symptoms may represent a serious problem that is an emergency. Do not wait to see if the symptoms will go away. Get medical help right away. Call your local emergency services (911 in the [place] Do not drive yourself to the hospital.  Summary Labor is your body's natural process of moving your baby and the placenta out of your uterus. The process of labor usually starts when your baby is full-term When labor starts, or if your water breaks, call your health care provider or nurse care line. Based on your situation, they will determine when you should go in for an exam. This information is not intended to replace advice given to you by your health care provider. Make sure you discuss any questions you have with your health care provider.  Document Revised: 03/30/2021 Document Reviewed: 03/30/2021-- adapted Elsevier Patient Education  2024 [employer].

## 2024-11-26 NOTE — MAU Provider Note (Signed)
 " History     CSN: [id number]  Arrival date and time: 11/26/24 0146 First Provider Initiated Contact with Patient 11/26/2024  2:02 AM  Chief Complaint  Patient presents with   Headache    HPI Candice Campos is a 33 y.o. H6E9888 at [redacted]w[redacted]d, 12/31/2024, Date entered prior to episode creation, who presents to the Maternity Assessment Unit via EMS for CP and SOB that woke her from sleep.  These are improving now, denies cough/congestion, GERD, sick contacts.   Endorses generalized HA, has not tried any medication. Reports home bp 140s-150s SBP.   ROS Contractions: No  Fetal Movement: Yes  Vaginal bleeding: No  LOF: No  (+) HA (-)    Past Medical History:  Diagnosis Date   Anemia    Anxiety    Diabetes mellitus without complication (HCC)    GERD (gastroesophageal reflux disease)    Hidradenitis suppurativa    Hypertension    PCOS (polycystic ovarian syndrome)    Vitamin D deficiency    Past Surgical History:  Procedure Laterality Date   BIOPSY  11/24/2022   Procedure: BIOPSY;  Surgeon: Cindie Carlin POUR, DO;  Location: [facility];  Service: Endoscopy;;   CESAREAN SECTION     CHOLECYSTECTOMY     ESOPHAGOGASTRODUODENOSCOPY (EGD) WITH PROPOFOL  N/A 11/24/2022   Procedure: ESOPHAGOGASTRODUODENOSCOPY (EGD) WITH PROPOFOL ;  Surgeon: Cindie Carlin POUR, DO;  Location: [facility];  Service: Endoscopy;  Laterality: N/A;  10:30am, asa 3   Allergies[1]  Physical Exam  BP 119/68   Pulse 82   Temp 98 F (36.7 C) (Oral)   Resp 16   Ht 5' 6 (1.676 m)   Wt 122.9 kg   LMP  (LMP Unknown)   SpO2 100%   BMI 43.74 kg/m   Gen: alert, no acute distress CV: regular rate Resp: nonlabored Abd: gravid  Cervical Exam  deferred  FHT Baseline: 130 bpm Variability: Good {> 6 bpm) Accelerations: Reactive Decelerations: Absent Uterine activity: None  Labs O/Positive/-- (07/16 1041)   Results for orders placed or performed during the hospital encounter of 11/26/24 (from  the past 24 hours)  CBC     Status: Abnormal   Collection Time: 11/26/24  2:22 AM  Result Value Ref Range   WBC 6.7 4.0 - 10.5 K/uL   RBC 4.17 3.87 - 5.11 MIL/uL   Hemoglobin 9.6 (L) 12.0 - 15.0 g/dL   HCT 68.5 (L) 63.9 - 53.9 %   MCV 75.3 (L) 80.0 - 100.0 fL   MCH 23.0 (L) 26.0 - 34.0 pg   MCHC 30.6 30.0 - 36.0 g/dL   RDW 82.8 (H) 88.4 - 84.4 %   Platelets 214 150 - 400 K/uL   nRBC 0.0 0.0 - 0.2 %  Comprehensive metabolic panel with GFR     Status: Abnormal   Collection Time: 11/26/24  2:22 AM  Result Value Ref Range   Sodium 137 135 - 145 mmol/L   Potassium 4.1 3.5 - 5.1 mmol/L   Chloride 107 98 - 111 mmol/L   CO2 19 (L) 22 - 32 mmol/L   Glucose, Bld 126 (H) 70 - 99 mg/dL   BUN 12 6 - 20 mg/dL   Creatinine, Ser 9.43 0.44 - 1.00 mg/dL   Calcium 8.8 (L) 8.9 - 10.3 mg/dL   Total Protein 6.2 (L) 6.5 - 8.1 g/dL   Albumin 3.4 (L) 3.5 - 5.0 g/dL   AST 15 15 - 41 U/L   ALT 7 0 - 44 U/L  Alkaline Phosphatase 121 38 - 126 U/L   Total Bilirubin <0.2 0.0 - 1.2 mg/dL   GFR, Estimated >39 >39 mL/min   Anion gap 11 5 - 15    Assessment and Plan  MDM Dulcemaria Campos is a 33 y.o. H6E9888 at [redacted]w[redacted]d, 12/31/2024, Date entered prior to episode creation, who presents to the MAU for now resolved CP and SOB. Ddx: elevated blood pressure includes but is not limited to: high risk conditions like preeclampsia or gestational hypertension; chronic hypertension; transient spurious elevated blood pressure, GERD, OSA/OHS, panic attack, nightmare wkaing.  Orders Placed This Encounter  Procedures   CBC   Comprehensive metabolic panel with GFR   Discharge patient   Meds ordered this encounter  Medications   acetaminophen  (TYLENOL ) tablet 1,000 mg   Pt HA feeling improved after acetaminophen . Her CP and SOB had resolved by the time she arrived to MAU. No sick symptoms to indicate viral testing. BP have been mild range since arrival and labs are stable since last week. Known preE/proteinuria, not  repeated today.    1. [redacted] weeks gestation of pregnancy (Primary)  2. Benign essential hypertension  3. Pre-eclampsia in third trimester - Discharge patient  4. BMI 40.0-44.9, adult [place])  Results pending at the time of DC: none Dispo: DC home in stable condition with return precautions discussed and included in AVS.    Candice Maier, DO FM-OB Fellow [facility] for [facility]     [1]  Allergies Allergen Reactions   Corticosteroids Palpitations and Other (See Comments)    Psychological reaction   Sertraline Other (See Comments)    Worsened anxiety, hyperhidrosis   Amoxicillin Other (See Comments)    Yeast infection   "

## 2024-11-26 NOTE — MAU Note (Signed)
 MAU Triage Note: Candice Campos is a 33 y.o. at [redacted]w[redacted]d here in MAU reporting: arrived via EMS after being seen for chest pain and SOB. Patient reports waking up around 2330 with chest pain, SOB, and headache. She says that that the chest pain and SOB have resolved, but the headache is still present. Denies VB or watery LOF. Reports +FM.  Does have GDM - EMS reports CBG of 142.  PIH Assessment: Headache present: Yes ;No treatment attempted Visual disturbances: None RUQ pain/Epigastric: None Atypical edema: None Hx of HBP: Pre-E BP Medications: Nifedipine  (Procardia ) 30 mg daily   Patient complaint: labor ck  Pain Score: 9  Pain Location: Head     Onset of complaint: today LMP: No LMP recorded (lmp unknown). Patient is pregnant.  Vitals:   11/26/24 0200  BP: (!) 152/98  Pulse: (!) 106  Resp: 16  Temp: 98 F (36.7 C)  SpO2: 99%    FHT:  Fetal Heart Rate Mode: External Baseline Rate (A): 145 bpm Multiple birth?: No Lab orders placed from triage: UA

## 2024-11-26 NOTE — Progress Notes (Signed)
 "  HIGH-RISK PREGNANCY VISIT Patient name: Candice Campos MRN [id number]  Date of birth: 07-15-1991 Chief Complaint:   Routine Prenatal Visit  History of Present Illness:   Candice Campos is a 33 y.o. H6E9888 female at [redacted]w[redacted]d with an Estimated Date of Delivery: 12/31/24 being seen today for ongoing management of a high-risk pregnancy complicated by [facility] w/ mild SIPE on nifedipine  30mg  daily, T2DM on metformin  1000AM/500PM, PGBMI 45.    Today she reports went to MAU last night w/ CP and SOB, workup wnl. Denies ha, visual changes, ruq/epigastric pain, n/v. Not checking sugars.  Contractions: Not present. Vag. Bleeding: None.  Movement: Present. denies leaking of fluid.      06/12/2024    9:46 AM  Depression screen PHQ 2/9  Decreased Interest 0  Down, Depressed, Hopeless 0  PHQ - 2 Score 0  Altered sleeping 0  Tired, decreased energy 0  Change in appetite 0  Feeling bad or failure about yourself  0  Trouble concentrating 0  Moving slowly or fidgety/restless 0  Suicidal thoughts 0  PHQ-9 Score 0      Data saved with a previous flowsheet row definition        06/12/2024    9:46 AM  GAD 7 : Generalized Anxiety Score  Nervous, Anxious, on Edge 0  Control/stop worrying 0  Worry too much - different things 0  Trouble relaxing 0  Restless 0  Easily annoyed or irritable 0  Afraid - awful might happen 0  Total GAD 7 Score 0     Review of Systems:   Pertinent items are noted in HPI Denies abnormal vaginal discharge w/ itching/odor/irritation, headaches, visual changes, shortness of breath, chest pain, abdominal pain, severe nausea/vomiting, or problems with urination or bowel movements unless otherwise stated above. Pertinent History Reviewed:  Reviewed past medical,surgical, social, obstetrical and family history.  Reviewed problem list, medications and allergies. Physical Assessment:   Vitals:   11/26/24 0952  BP: 139/89  Pulse: 83  Weight: 286 lb (129.7 kg)  Body mass  index is 46.16 kg/m.           Physical Examination:   General appearance: alert, well appearing, and in no distress  Mental status: alert, oriented to person, place, and time  Skin: warm & dry   Extremities:      Cardiovascular: normal heart rate noted  Respiratory: normal respiratory effort, no distress  Abdomen: gravid, soft, non-tender  Pelvic: Cervical exam deferred         Fetal Status:     Movement: Present    Fetal Surveillance Testing today: US  35 wks,cephalic,AFI 24 cm,BPP 8/8,FHR 130 bpm,RI .59,.59,.67=66%,anterior placenta gr 3   Chaperone: N/A  Results for orders placed or performed in visit on 11/26/24 (from the past 24 hours)  POC Urinalysis Dipstick OB   Collection Time: 11/26/24  9:56 AM  Result Value Ref Range   Color, UA     Clarity, UA     Glucose, UA Negative Negative   Bilirubin, UA     Ketones, UA neg    Spec Grav, UA     Blood, UA neg    pH, UA     POC,PROTEIN,UA Moderate (2+) Negative, Trace, Small (1+), Moderate (2+), Large (3+), 4+   Urobilinogen, UA     Nitrite, UA neg    Leukocytes, UA Negative Negative   Appearance     Odor    Results for orders placed or performed during the hospital encounter of 11/26/24 (  from the past 24 hours)  CBC   Collection Time: 11/26/24  2:22 AM  Result Value Ref Range   WBC 6.7 4.0 - 10.5 K/uL   RBC 4.17 3.87 - 5.11 MIL/uL   Hemoglobin 9.6 (L) 12.0 - 15.0 g/dL   HCT 68.5 (L) 63.9 - 53.9 %   MCV 75.3 (L) 80.0 - 100.0 fL   MCH 23.0 (L) 26.0 - 34.0 pg   MCHC 30.6 30.0 - 36.0 g/dL   RDW 82.8 (H) 88.4 - 84.4 %   Platelets 214 150 - 400 K/uL   nRBC 0.0 0.0 - 0.2 %  Comprehensive metabolic panel with GFR   Collection Time: 11/26/24  2:22 AM  Result Value Ref Range   Sodium 137 135 - 145 mmol/L   Potassium 4.1 3.5 - 5.1 mmol/L   Chloride 107 98 - 111 mmol/L   CO2 19 (L) 22 - 32 mmol/L   Glucose, Bld 126 (H) 70 - 99 mg/dL   BUN 12 6 - 20 mg/dL   Creatinine, Ser 9.43 0.44 - 1.00 mg/dL   Calcium 8.8 (L) 8.9 -  10.3 mg/dL   Total Protein 6.2 (L) 6.5 - 8.1 g/dL   Albumin 3.4 (L) 3.5 - 5.0 g/dL   AST 15 15 - 41 U/L   ALT 7 0 - 44 U/L   Alkaline Phosphatase 121 38 - 126 U/L   Total Bilirubin <0.2 0.0 - 1.2 mg/dL   GFR, Estimated >39 >39 mL/min   Anion gap 11 5 - 15  Results for orders placed or performed in visit on 11/25/24 (from the past 24 hours)  POC Urinalysis Dipstick OB   Collection Time: 11/25/24 11:38 AM  Result Value Ref Range   Color, UA     Clarity, UA     Glucose, UA Trace (A) Negative   Bilirubin, UA     Ketones, UA trace    Spec Grav, UA     Blood, UA neg    pH, UA     POC,PROTEIN,UA Large (3+) Negative, Trace, Small (1+), Moderate (2+), Large (3+), 4+   Urobilinogen, UA     Nitrite, UA neg    Leukocytes, UA Negative Negative   Appearance     Odor    CBC   Collection Time: 11/25/24 12:06 PM  Result Value Ref Range   WBC 5.0 3.4 - 10.8 x10E3/uL   RBC 4.28 3.77 - 5.28 x10E6/uL   Hemoglobin 10.0 (L) 11.1 - 15.9 g/dL   Hematocrit 68.0 (L) 65.9 - 46.6 %   MCV 75 (L) 79 - 97 fL   MCH 23.4 (L) 26.6 - 33.0 pg   MCHC 31.3 (L) 31.5 - 35.7 g/dL   RDW 82.8 (H) 88.2 - 84.5 %   Platelets 205 150 - 450 x10E3/uL  Comprehensive metabolic panel with GFR   Collection Time: 11/25/24 12:06 PM  Result Value Ref Range   Glucose 133 (H) 70 - 99 mg/dL   BUN 10 6 - 20 mg/dL   Creatinine, Ser 9.43 (L) 0.57 - 1.00 mg/dL   eGFR 875 >40 fO/fpw/8.26   BUN/Creatinine Ratio 18 9 - 23   Sodium 138 134 - 144 mmol/L   Potassium 4.0 3.5 - 5.2 mmol/L   Chloride 104 96 - 106 mmol/L   CO2 18 (L) 20 - 29 mmol/L   Calcium 9.1 8.7 - 10.2 mg/dL   Total Protein 5.8 (L) 6.0 - 8.5 g/dL   Albumin 3.2 (L) 3.9 - 4.9 g/dL  Globulin, Total 2.6 1.5 - 4.5 g/dL   Bilirubin Total <9.7 0.0 - 1.2 mg/dL   Alkaline Phosphatase 121 (H) 41 - 116 IU/L   AST 8 0 - 40 IU/L   ALT 9 0 - 32 IU/L    Assessment & Plan:  High-risk pregnancy: H6E9888 at [redacted]w[redacted]d with an Estimated Date of Delivery: 12/31/24   1) CHTN w/  superimposed pre-e w/o severe features, h/o severe pre-e w/ 34w delivery last pregnancy. Asymptomatic, 2+ proteinuria today. BPP 8/8, UAD 66%. Check bp's bid, if >160/110 or pre-e sx go to [facility]. IOL scheduled for 1/13 @[place] ,  IOL form faxed and orders placed    2) T2DM, on metformin  500AM/1000PM, EFW 57% at 33w, normal AFI. Not checking sugars. Discussed importance of QID testing, to begin again, let us  know if elevated in between appts   3) Prev c/s> wants TOLAC, consent 11/14   4) PGBMI 45> currently 46  Meds: No orders of the defined types were placed in this encounter.   Labs/procedures today: U/S  Treatment Plan:  EFW q4w, 2x/wk testing, IOL 37w  Reviewed: Preterm labor symptoms and general obstetric precautions including but not limited to vaginal bleeding, contractions, leaking of fluid and fetal movement were reviewed in detail with the patient.  All questions were answered. Does have home bp cuff. Office bp cuff given: not applicable.   Follow-up: Return for As scheduled.   Future Appointments  Date Time Provider Department Center  11/29/2024  9:50 AM [facility] NURSE [facility]  12/03/2024  8:30 AM [facility] IMG 2 [facility] None  12/03/2024  9:30 AM Jayne Vonn DEL, MD [facility]  12/06/2024  9:10 AM [facility] NURSE [facility]  12/10/2024 12:00 AM [facility] SCHED ROOM [facility] None  12/10/2024  8:30 AM [facility] US  [facility] None  12/10/2024  9:30 AM Marilynn Nest, DO [facility]  12/13/2024  9:10 AM [facility] NURSE [facility]  12/17/2024  8:30 AM [facility] IMG 2 [facility] None  12/17/2024  9:30 AM Kizzie Suzen SAUNDERS, CNM [facility]  12/20/2024  9:10 AM [facility] NURSE [facility]  12/24/2024  8:30 AM [facility] US  [facility] None  12/24/2024  9:30 AM Ozan, Jennifer, DO [facility]  12/27/2024  9:10 AM [facility] NURSE [facility]  12/31/2024  8:30 AM [facility] FT IMG 2 [facility] None  12/31/2024  9:30 AM Eure, Vonn DEL, MD [facility]    Orders Placed  This Encounter  Procedures   POC Urinalysis Dipstick OB   Suzen SAUNDERS Kizzie CNM, [facility] 11/26/2024 10:48 AM  "

## 2024-11-29 ENCOUNTER — Ambulatory Visit: Admitting: *Deleted

## 2024-11-29 VITALS — BP 139/94 | HR 77

## 2024-11-29 DIAGNOSIS — O10913 Unspecified pre-existing hypertension complicating pregnancy, third trimester: Secondary | ICD-10-CM | POA: Diagnosis not present

## 2024-11-29 DIAGNOSIS — O24113 Pre-existing diabetes mellitus, type 2, in pregnancy, third trimester: Secondary | ICD-10-CM

## 2024-11-29 DIAGNOSIS — E119 Type 2 diabetes mellitus without complications: Secondary | ICD-10-CM

## 2024-11-29 DIAGNOSIS — Z3A35 35 weeks gestation of pregnancy: Secondary | ICD-10-CM | POA: Diagnosis not present

## 2024-11-29 DIAGNOSIS — I10 Essential (primary) hypertension: Secondary | ICD-10-CM

## 2024-11-29 NOTE — Progress Notes (Signed)
" ° °  NURSE VISIT- NST  SUBJECTIVE:  Candice Campos is a 34 y.o. [phone] female at [redacted]w[redacted]d, here for a NST for pregnancy complicated by [facility] and Diabetes: T2DM.  She reports active fetal movement, contractions: none, vaginal bleeding: none, membranes: intact.   OBJECTIVE:  BP (!) 139/94   Pulse 77   LMP  (LMP Unknown)   Appears well, no apparent distress  No results found for this or any previous visit (from the past 24 hours).  NST: FHR baseline 140 bpm, Variability: moderate, Accelerations:present, Decelerations:  Absent= Cat 1/reactive Toco: none   ASSESSMENT: H6E9888 at [redacted]w[redacted]d with CHTN and Diabetes: T2DM NST reactive  PLAN: EFM strip reviewed by Dr. Ozan   Recommendations: keep next appointment as scheduled    Rutherford Rover  11/29/2024 10:57 AM  "

## 2024-12-03 ENCOUNTER — Encounter (HOSPITAL_COMMUNITY): Payer: Self-pay | Admitting: *Deleted

## 2024-12-03 ENCOUNTER — Ambulatory Visit (INDEPENDENT_AMBULATORY_CARE_PROVIDER_SITE_OTHER): Admitting: Radiology

## 2024-12-03 ENCOUNTER — Encounter (HOSPITAL_COMMUNITY): Payer: Self-pay | Admitting: Obstetrics and Gynecology

## 2024-12-03 ENCOUNTER — Ambulatory Visit (INDEPENDENT_AMBULATORY_CARE_PROVIDER_SITE_OTHER): Admitting: Obstetrics & Gynecology

## 2024-12-03 ENCOUNTER — Inpatient Hospital Stay (HOSPITAL_COMMUNITY)
Admission: AD | Admit: 2024-12-03 | Discharge: 2024-12-03 | Disposition: A | Discharge status: Home / Self Care | Attending: Obstetrics and Gynecology | Admitting: Obstetrics and Gynecology

## 2024-12-03 ENCOUNTER — Other Ambulatory Visit (HOSPITAL_COMMUNITY)
Admission: RE | Admit: 2024-12-03 | Discharge: 2024-12-03 | Disposition: A | Discharge status: Home / Self Care | Source: Ambulatory Visit | Attending: Obstetrics & Gynecology | Admitting: Obstetrics & Gynecology

## 2024-12-03 ENCOUNTER — Telehealth (HOSPITAL_COMMUNITY): Payer: Self-pay | Admitting: *Deleted

## 2024-12-03 VITALS — BP 144/99 | HR 97 | Wt 285.0 lb

## 2024-12-03 DIAGNOSIS — Z6841 Body Mass Index (BMI) 40.0 and over, adult: Secondary | ICD-10-CM

## 2024-12-03 DIAGNOSIS — O0993 Supervision of high risk pregnancy, unspecified, third trimester: Secondary | ICD-10-CM | POA: Insufficient documentation

## 2024-12-03 DIAGNOSIS — O119 Pre-existing hypertension with pre-eclampsia, unspecified trimester: Secondary | ICD-10-CM

## 2024-12-03 DIAGNOSIS — Z98891 History of uterine scar from previous surgery: Secondary | ICD-10-CM

## 2024-12-03 DIAGNOSIS — O09893 Supervision of other high risk pregnancies, third trimester: Secondary | ICD-10-CM

## 2024-12-03 DIAGNOSIS — O0990 Supervision of high risk pregnancy, unspecified, unspecified trimester: Secondary | ICD-10-CM

## 2024-12-03 DIAGNOSIS — O34219 Maternal care for unspecified type scar from previous cesarean delivery: Secondary | ICD-10-CM

## 2024-12-03 DIAGNOSIS — O10013 Pre-existing essential hypertension complicating pregnancy, third trimester: Secondary | ICD-10-CM | POA: Diagnosis not present

## 2024-12-03 DIAGNOSIS — Z3A36 36 weeks gestation of pregnancy: Secondary | ICD-10-CM | POA: Insufficient documentation

## 2024-12-03 DIAGNOSIS — O24419 Gestational diabetes mellitus in pregnancy, unspecified control: Secondary | ICD-10-CM

## 2024-12-03 DIAGNOSIS — O113 Pre-existing hypertension with pre-eclampsia, third trimester: Secondary | ICD-10-CM

## 2024-12-03 DIAGNOSIS — E119 Type 2 diabetes mellitus without complications: Secondary | ICD-10-CM | POA: Diagnosis not present

## 2024-12-03 DIAGNOSIS — Z7984 Long term (current) use of oral hypoglycemic drugs: Secondary | ICD-10-CM | POA: Diagnosis not present

## 2024-12-03 DIAGNOSIS — O24313 Unspecified pre-existing diabetes mellitus in pregnancy, third trimester: Secondary | ICD-10-CM | POA: Diagnosis not present

## 2024-12-03 DIAGNOSIS — O10913 Unspecified pre-existing hypertension complicating pregnancy, third trimester: Secondary | ICD-10-CM

## 2024-12-03 DIAGNOSIS — R102 Pelvic and perineal pain unspecified side: Secondary | ICD-10-CM | POA: Diagnosis present

## 2024-12-03 DIAGNOSIS — O09293 Supervision of pregnancy with other poor reproductive or obstetric history, third trimester: Secondary | ICD-10-CM | POA: Insufficient documentation

## 2024-12-03 DIAGNOSIS — O24319 Unspecified pre-existing diabetes mellitus in pregnancy, unspecified trimester: Secondary | ICD-10-CM

## 2024-12-03 DIAGNOSIS — O10919 Unspecified pre-existing hypertension complicating pregnancy, unspecified trimester: Secondary | ICD-10-CM

## 2024-12-03 DIAGNOSIS — R030 Elevated blood-pressure reading, without diagnosis of hypertension: Secondary | ICD-10-CM | POA: Diagnosis present

## 2024-12-03 DIAGNOSIS — O99019 Anemia complicating pregnancy, unspecified trimester: Secondary | ICD-10-CM

## 2024-12-03 DIAGNOSIS — O24913 Unspecified diabetes mellitus in pregnancy, third trimester: Secondary | ICD-10-CM

## 2024-12-03 DIAGNOSIS — R519 Headache, unspecified: Secondary | ICD-10-CM | POA: Diagnosis present

## 2024-12-03 LAB — URINALYSIS, ROUTINE W REFLEX MICROSCOPIC
Bilirubin Urine: NEGATIVE
Glucose, UA: 150 mg/dL — AB
Hgb urine dipstick: NEGATIVE
Ketones, ur: 20 mg/dL — AB
Leukocytes,Ua: NEGATIVE
Nitrite: NEGATIVE
Protein, ur: 100 mg/dL — AB
Specific Gravity, Urine: 1.018 (ref 1.005–1.030)
pH: 6 (ref 5.0–8.0)

## 2024-12-03 LAB — COMPREHENSIVE METABOLIC PANEL WITH GFR
ALT: 11 U/L (ref 0–44)
AST: 19 U/L (ref 15–41)
Albumin: 3.3 g/dL — ABNORMAL LOW (ref 3.5–5.0)
Alkaline Phosphatase: 138 U/L — ABNORMAL HIGH (ref 38–126)
Anion gap: 13 (ref 5–15)
BUN: 9 mg/dL (ref 6–20)
CO2: 19 mmol/L — ABNORMAL LOW (ref 22–32)
Calcium: 9.3 mg/dL (ref 8.9–10.3)
Chloride: 105 mmol/L (ref 98–111)
Creatinine, Ser: 0.65 mg/dL (ref 0.44–1.00)
GFR, Estimated: >60 mL/min
Glucose, Bld: 115 mg/dL — ABNORMAL HIGH (ref 70–99)
Potassium: 3.9 mmol/L (ref 3.5–5.1)
Sodium: 137 mmol/L (ref 135–145)
Total Bilirubin: 0.3 mg/dL (ref 0.0–1.2)
Total Protein: 6.8 g/dL (ref 6.5–8.1)

## 2024-12-03 LAB — CBC
HCT: 34.4 % — ABNORMAL LOW (ref 36.0–46.0)
Hemoglobin: 10.5 g/dL — ABNORMAL LOW (ref 12.0–15.0)
MCH: 23.1 pg — ABNORMAL LOW (ref 26.0–34.0)
MCHC: 30.5 g/dL (ref 30.0–36.0)
MCV: 75.6 fL — ABNORMAL LOW (ref 80.0–100.0)
Platelets: 240 K/uL (ref 150–400)
RBC: 4.55 MIL/uL (ref 3.87–5.11)
RDW: 17.2 % — ABNORMAL HIGH (ref 11.5–15.5)
WBC: 7.8 K/uL (ref 4.0–10.5)
nRBC: 0 % (ref 0.0–0.2)

## 2024-12-03 LAB — PROTEIN / CREATININE RATIO, URINE
Creatinine, Urine: 107 mg/dL
Protein Creatinine Ratio: 0.9 mg/mg — ABNORMAL HIGH
Total Protein, Urine: 101 mg/dL

## 2024-12-03 MED ORDER — NIFEDIPINE ER OSMOTIC RELEASE 60 MG PO TB24: 60.0000 mg | ORAL_TABLET | Freq: Every day | ORAL | 1 refills | Status: DC

## 2024-12-03 NOTE — Progress Notes (Signed)
 US : GA = 36 weeks Single active fetus, cephalic, FHR = 136 bpm, anterior pl, gr2, AFI = 18.8 cm, MVP = 5.7 cm, BPP = 8/8, RI: 0.47, 0.55,   0.51 23%

## 2024-12-03 NOTE — Progress Notes (Signed)
 "   HIGH-RISK PREGNANCY VISIT Patient name: Candice Campos MRN [id number]  Date of birth: [birth year] Chief Complaint:   Routine Prenatal Visit  History of Present Illness:   Candice Campos is a 34 y.o. [id number] female at [redacted]w[redacted]d with an Estimated Date of Delivery: 12/31/24 being seen today for ongoing management of a high-risk pregnancy complicated by     ICD-10-CM   1. Supervision of high risk pregnancy, antepartum  O09.90 Cervicovaginal ancillary only( [place])    Culture, beta strep (group b only)    2. [redacted] weeks gestation of pregnancy  Z3A.36 Cervicovaginal ancillary only( [place])    Culture, beta strep (group b only)    3. Chronic hypertension with superimposed pre-eclampsia: procardia  xl 30 qd ^60 mg qd today  O11.9     4. Class B- MTF 1000mg  am/500 pm: good control EFW 53%  O24.319     5. Type 2 diabetes mellitus  E11.9     6. Previous cesarean section  Z98.891     7. Desires VBAC (vaginal birth after cesarean) trial: consent signed  O34.219      .    Today she reports no complaints. Contractions: Not present. Vag. Bleeding: None.  Movement: Present. denies leaking of fluid.      06/12/2024    9:46 AM  Depression screen PHQ 2/9  Decreased Interest 0  Down, Depressed, Hopeless 0  PHQ - 2 Score 0  Altered sleeping 0  Tired, decreased energy 0  Change in appetite 0  Feeling bad or failure about yourself  0  Trouble concentrating 0  Moving slowly or fidgety/restless 0  Suicidal thoughts 0  PHQ-9 Score 0      Data saved with a previous flowsheet row definition        06/12/2024    9:46 AM  GAD 7 : Generalized Anxiety Score  Nervous, Anxious, on Edge 0  Control/stop worrying 0  Worry too much - different things 0  Trouble relaxing 0  Restless 0  Easily annoyed or irritable 0  Afraid - awful might happen 0  Total GAD 7 Score 0     Review of Systems:   Pertinent items are noted in HPI Denies abnormal vaginal discharge w/ itching/odor/irritation,  headaches, visual changes, shortness of breath, chest pain, abdominal pain, severe nausea/vomiting, or problems with urination or bowel movements unless otherwise stated above. Pertinent History Reviewed:  Reviewed past medical,surgical, social, obstetrical and family history.  Reviewed problem list, medications and allergies. Physical Assessment:   Vitals:   12/03/24 0857 12/03/24 0901  BP: (!) 152/100 (!) 144/99  Pulse: 94 97  Weight: 285 lb (129.3 kg)   Body mass index is 46 kg/m.           Physical Examination:   General appearance: alert, well appearing, and in no distress  Mental status: alert, oriented to person, place, and time  Skin: warm & dry   Extremities:      Cardiovascular: normal heart rate noted  Respiratory: normal respiratory effort, no distress  Abdomen: gravid, soft, non-tender  Pelvic: Cervical exam performed LTC        Fetal Status:     Movement: Present    Fetal Surveillance Testing today: BPP 8/8 UAD normal   Chaperone: Alan Fischer    No results found for this or any previous visit (from the past 24 hours).  Assessment & Plan:  High-risk pregnancy: [id number] at [redacted]w[redacted]d with an Estimated Date of Delivery: 12/31/24  ICD-10-CM   1. Supervision of high risk pregnancy, antepartum  O09.90 Cervicovaginal ancillary only( [place])    Culture, beta strep (group b only)    2. [redacted] weeks gestation of pregnancy  Z3A.36 Cervicovaginal ancillary only( [place])    Culture, beta strep (group b only)    3. Chronic hypertension with superimposed pre-eclampsia: procardia  xl 30 qd ^60 mg qd today  O11.9     4. Class B- MTF 1000mg  am/500 pm: good control EFW 53%  O24.319     5. Type 2 diabetes mellitus  E11.9     6. Previous cesarean section  Z98.891     7. Desires VBAC (vaginal birth after cesarean) trial: consent signed  O34.219          Meds:  Meds ordered this encounter  Medications   NIFEdipine  (PROCARDIA  XL) 60 MG 24 hr tablet    Sig: Take 1  tablet (60 mg total) by mouth daily.    Dispense:  30 tablet    Refill:  1    Orders:  Orders Placed This Encounter  Procedures   Culture, beta strep (group b only)     Labs/procedures today: U/S  Treatment Plan:  IOL next week, appts scheduled prior   Follow-up: Return for keep scheduled.   Future Appointments  Date Time Provider Department Center  12/06/2024  9:10 AM [facility] NURSE [facility]  12/10/2024 12:00 AM [facility] SCHED ROOM [facility] None    Orders Placed This Encounter  Procedures   Culture, beta strep (group b only)   Vonn VEAR Inch  Attending Physician for the Center for [facility] 12/03/2024 9:41 AM  "

## 2024-12-03 NOTE — Telephone Encounter (Signed)
 Preadmission screen

## 2024-12-03 NOTE — MAU Note (Addendum)
 Candice Campos is a 34 y.o. at [redacted]w[redacted]d here in MAU reporting her B/P was up tonight at 173/111. Denies VB or LOF. Reports good FM. Having some pelvic pressure when walking. Took her bp med this am. States developed a h/a on the way to the hospital   LMP: na Onset of complaint: tonight Pain score: 5 Vitals:   12/03/24 2139 12/03/24 2144  BP:  (!) 159/92  Pulse: (!) 134   Resp: 20   Temp: 98.6 F (37 C)   SpO2: 100%      FHT: 150  Lab orders placed from triage: u/a

## 2024-12-03 NOTE — MAU Provider Note (Signed)
 " History     [id number]  Arrival date and time: 12/03/24 2103    Chief Complaint  Patient presents with   Hypertension     HPI Candice Campos is a 34 y.o. at [redacted]w[redacted]d who presents for single elevated blood pressure reading at home.  She states that she took her blood pressure earlier this afternoon and it was in the 150s systolic which is normal for her.  She then got on video chat and her daughter was acting hysterical and she took her blood pressure directly after that conversation and her blood pressure was 170 systolic over 110s diastolic so she came to the MAU.  Of note she has a significant medical history of chronic hypertension with superimposed preeclampsia by protein creatinine ratio.  She states otherwise her blood pressures have been consistent.  She did see her OB provider this morning who increased her nifedipine  to 60 mg daily.  She did take the 60 mg dose this morning.  She denies headache, vision changes, right upper quadrant pain.  She denies vaginal bleeding, leakage of fluid, contractions.  Endorses good fetal movement.   O/Positive/-- (07/16 1041)  Past Medical History:  Diagnosis Date   Anemia    Anxiety    Diabetes mellitus without complication (HCC)    GERD (gastroesophageal reflux disease)    Hidradenitis suppurativa    History of severe pre-eclampsia    Hypertension    PCOS (polycystic ovarian syndrome)    Vitamin D deficiency     Past Surgical History:  Procedure Laterality Date   BIOPSY  11/24/2022   Procedure: BIOPSY;  Surgeon: Cindie Carlin POUR, DO;  [facility]: [facility];  Service: Endoscopy;;   CESAREAN SECTION     CHOLECYSTECTOMY     ESOPHAGOGASTRODUODENOSCOPY (EGD) WITH PROPOFOL  N/A 11/24/2022   Procedure: ESOPHAGOGASTRODUODENOSCOPY (EGD) WITH PROPOFOL ;  Surgeon: Cindie Carlin POUR, DO;  Location: [facility];  Service: Endoscopy;  Laterality: N/A;  10:30am, asa 3    Family History  Problem Relation Age of Onset   Hypertension Mother     Diabetes Mother     Social History   Socioeconomic History   Marital status: Married    Spouse name: Not on file   Number of children: Not on file   Years of education: Not on file   Highest education level: Not on file  Occupational History   Not on file  Tobacco Use   Smoking status: Former    Current packs/day: 1.00    Types: Cigarettes    Passive exposure: Past   Smokeless tobacco: Never  Vaping Use   Vaping status: Never Used  Substance and Sexual Activity   Alcohol use: No   Drug use: No   Sexual activity: Not Currently    Birth control/protection: None  Other Topics Concern   Not on file  Social History Narrative   Not on file   Social Drivers of Health   Tobacco Use: Medium Risk (12/03/2024)   Patient History    Smoking Tobacco Use: Former    Smokeless Tobacco Use: Never    Passive Exposure: Past  Physicist, Medical Strain: Low Risk (06/12/2024)   Overall Financial Resource Strain (CARDIA)    Difficulty of Paying Living Expenses: Not hard at all  Food Insecurity: No Food Insecurity (06/12/2024)   Epic    Worried About Programme Researcher, Broadcasting/film/video in the Last Year: Never true    Ran Out of Food in the Last Year: Never true  Transportation Needs:  No Transportation Needs (06/12/2024)   Epic    Lack of Transportation (Medical): No    Lack of Transportation (Non-Medical): No  Physical Activity: Insufficiently Active (06/12/2024)   Exercise Vital Sign    Days of Exercise per Week: 3 days    Minutes of Exercise per Session: 30 min  Stress: No Stress Concern Present (06/12/2024)   [employer] of Occupational Health - Occupational Stress Questionnaire    Feeling of Stress: Only a little  Social Connections: Moderately Isolated (06/12/2024)   Social Connection and Isolation Panel    Frequency of Communication with Friends and Family: Three times a week    Frequency of Social Gatherings with Friends and Family: Twice a week    Attends Religious Services: Never    Automotive Engineer or Organizations: No    Attends Banker Meetings: Never    Marital Status: Married  Catering Manager Violence: Not At Risk (06/12/2024)   Epic    Fear of Current or Ex-Partner: No    Emotionally Abused: No    Physically Abused: No    Sexually Abused: No  Depression (PHQ2-9): Low Risk (06/12/2024)   Depression (PHQ2-9)    PHQ-2 Score: 0  Alcohol Screen: Low Risk (06/12/2024)   Alcohol Screen    Last Alcohol Screening Score (AUDIT): 0  Housing: Low Risk (06/12/2024)   Epic    Unable to Pay for Housing in the Last Year: No    Number of Times Moved in the Last Year: 0    Homeless in the Last Year: No  Utilities: Not At Risk (06/12/2024)   Epic    Threatened with loss of utilities: No  Health Literacy: Low Risk (02/26/2024)   Received from [facility] Liter[facility]    How often do you need to have someone help you when you read instructions, pamphlets, or other written material from your doctor or pharmacy?: Never    Allergies[1]  Medications Ordered Prior to Encounter[2]  Pertinent positives and negative per HPI, all others reviewed and negative  Physical Exam   BP (!) 144/69   Pulse 94   Temp 98.6 F (37 C)   Resp 20   Ht 5' 6 (1.676 m)   Wt 129.5 kg   LMP  (LMP Unknown)   SpO2 98%   BMI 46.08 kg/m   Patient Vitals for the past 24 hrs:  BP Temp Pulse Resp SpO2 Height Weight  12/03/24 2316 (!) 144/69 -- 94 -- -- -- --  12/03/24 2301 (!) 141/61 -- 87 -- -- -- --  12/03/24 2246 (!) 140/69 -- 97 -- -- -- --  12/03/24 2230 126/66 -- 100 -- 98 % -- --  12/03/24 2216 (!) 150/67 -- (!) 129 -- 99 % -- --  12/03/24 2205 (!) 178/96 -- (!) 147 -- 100 % -- --  12/03/24 2144 (!) 159/92 -- -- -- -- -- --  12/03/24 2139 -- 98.6 F (37 C) (!) 134 20 100 % 5' 6 (1.676 m) 129.5 kg    Physical Exam Vitals and nursing note reviewed.  Constitutional:      Appearance: She is well-developed.  HENT:     Head: Normocephalic and atraumatic.      Mouth/Throat:     Mouth: Mucous membranes are moist.  Eyes:     Extraocular Movements: Extraocular movements intact.  Cardiovascular:     Rate and Rhythm: Normal rate and regular rhythm.  Pulmonary:     Effort:  Pulmonary effort is normal.  Abdominal:     Palpations: Abdomen is soft.     Tenderness: There is no abdominal tenderness.  Skin:    Capillary Refill: Capillary refill takes less than 2 seconds.  Neurological:     General: No focal deficit present.     Mental Status: She is alert.      FHT Baseline: 145 bpm Variability: Good {> 6 bpm) Accelerations: Reactive Decelerations: Absent Uterine activity: None  Labs Results for orders placed or performed during the hospital encounter of 12/03/24 (from the past 24 hours)  Protein / creatinine ratio, urine     Status: Abnormal   Collection Time: 12/03/24  9:22 PM  Result Value Ref Range   Creatinine, Urine 107 mg/dL   Total Protein, Urine 101 mg/dL   Protein Creatinine Ratio 0.9 (H) <0.2 mg/mg  Urinalysis, Routine w reflex microscopic -Urine, Clean Catch     Status: Abnormal   Collection Time: 12/03/24  9:22 PM  Result Value Ref Range   Color, Urine YELLOW YELLOW   APPearance HAZY (A) CLEAR   Specific Gravity, Urine 1.018 1.005 - 1.030   pH 6.0 5.0 - 8.0   Glucose, UA 150 (A) NEGATIVE mg/dL   Hgb urine dipstick NEGATIVE NEGATIVE   Bilirubin Urine NEGATIVE NEGATIVE   Ketones, ur 20 (A) NEGATIVE mg/dL   Protein, ur 899 (A) NEGATIVE mg/dL   Nitrite NEGATIVE NEGATIVE   Leukocytes,Ua NEGATIVE NEGATIVE   RBC / HPF 0-5 0 - 5 RBC/hpf   WBC, UA 0-5 0 - 5 WBC/hpf   Bacteria, UA RARE (A) NONE SEEN   Squamous Epithelial / HPF 0-5 0 - 5 /HPF   Mucus PRESENT    Ca Oxalate Crys, UA PRESENT   CBC     Status: Abnormal   Collection Time: 12/03/24  9:35 PM  Result Value Ref Range   WBC 7.8 4.0 - 10.5 K/uL   RBC 4.55 3.87 - 5.11 MIL/uL   Hemoglobin 10.5 (L) 12.0 - 15.0 g/dL   HCT 65.5 (L) 63.9 - 53.9 %   MCV 75.6 (L) 80.0 -  100.0 fL   MCH 23.1 (L) 26.0 - 34.0 pg   MCHC 30.5 30.0 - 36.0 g/dL   RDW 82.7 (H) 88.4 - 84.4 %   Platelets 240 150 - 400 K/uL   nRBC 0.0 0.0 - 0.2 %  Comprehensive metabolic panel     Status: Abnormal   Collection Time: 12/03/24  9:35 PM  Result Value Ref Range   Sodium 137 135 - 145 mmol/L   Potassium 3.9 3.5 - 5.1 mmol/L   Chloride 105 98 - 111 mmol/L   CO2 19 (L) 22 - 32 mmol/L   Glucose, Bld 115 (H) 70 - 99 mg/dL   BUN 9 6 - 20 mg/dL   Creatinine, Ser 9.34 0.44 - 1.00 mg/dL   Calcium 9.3 8.9 - 89.6 mg/dL   Total Protein 6.8 6.5 - 8.1 g/dL   Albumin 3.3 (L) 3.5 - 5.0 g/dL   AST 19 15 - 41 U/L   ALT 11 0 - 44 U/L   Alkaline Phosphatase 138 (H) 38 - 126 U/L   Total Bilirubin 0.3 0.0 - 1.2 mg/dL   GFR, Estimated >39 >39 mL/min   Anion gap 13 5 - 15    Imaging US  FETAL BPP WO NON STRESS Result Date: 12/03/2024 Table formatting from the original result was not included. Images from the original result were not included.  ..an [employer] of  Ultrasound Medicine TECHNICAL SALES ENGINEER) accredited practice Center for [facility] @ [facility] [address] [place] [address] Ordering Provider: Ozan, Jennifer, DO FOLLOW UP SONOGRAM Ireta Pullman is in the office for a follow up sonogram for BPP/cord dopplers. She is a 34 y.o. year old G3P0111 with Estimated Date of Delivery: 12/31/24 by early ultrasound now at  [redacted]w[redacted]d weeks gestation. 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 GESTATION: SINGLETON PRESENTATION: cephalic FETAL ACTIVITY:          Heart rate         136 bpm          The fetus is active. AMNIOTIC FLUID: The amniotic fluid volume is  normal, AFI = 18.8 cm, MVP = 5.7 cm. PLACENTA LOCALIZATION:  anterior GRADE 2 CERVIX: Not seen ADNEXA: Not seen GESTATIONAL AGE AND  BIOMETRICS: Gestational criteria: Estimated Date of Delivery: 12/31/24 by early ultrasound now at [redacted]w[redacted]d Previous Scans:7 BIOPHYSICAL PROFILE:                                                                                                       COMMENTS GROSS BODY MOVEMENT                 2  TONE                2  RESPIRATIONS                2  AMNIOTIC FLUID                2                                                          SCORE:  8/8 (Note: NST was not performed as part of this antepartum testing)  DOPPLER FLOW STUDIES: UMBILICAL ARTERY RI RATIOS: RI: 0.47, 0.55,    0.51 23%  ANATOMICAL SURVEY                                                                            COMMENTS CEREBRAL VENTRICLES yes normal  CHOROID PLEXUS yes normal  CEREBELLUM yes normal  CISTERNA MAGNA  Yes  normal   CAVUM SEPTI PELLUCIDI YES NORMAL                      4 CHAMBERED HEART yes Decreased resolution                      DIAPHRAGM yes normal  STOMACH yes normal  RENAL REGION yes normal  BLADDER yes normal          3 VESSEL CORD yes normal  SPINE yes normal          GENITALIA yes Previously seen female     SUSPECTED ABNORMALITIES:  no QUALITY OF SCAN: Poor resolution due to BMI/advanced GA TECHNICIAN COMMENTS: US : GA = 36 weeks Single active fetus, cephalic, FHR = 136 bpm, anterior pl, gr2, AFI = 18.8 cm, MVP = 5.7 cm, BPP = 8/8, RI: 0.47, 0.55,   0.51 23% A copy of this report including all images has been saved and backed up to a second source for retrieval if needed. All measures and details of the anatomical scan, placentation, fluid volume and pelvic anatomy are contained in that report. Sharlet GORMAN Fair 12/03/2024 9:05 AM Clinical Impression and recommendations: I have reviewed the sonogram results above, combined with the patient's current clinical course, below are my impressions and any appropriate recommendations for management based on the sonographic findings. 1.  [id number] Estimated Date of Delivery: 12/31/24 by serial sonographic evaluations 2.  Fetal sonographic surveillance findings: a). Normal fluid volume b). Normal antepartum fetal assessment with BPP 8/8 c). Normal fetal  Doppler ratios with consistent diastolic flow:  23% 3.  Normal general sonographic findings Recommend continued prenatal evaluations and care based on this sonogram and as clinically indicated from the patient's clinical course. Vonn VEAR Inch 12/03/2024 9:14 AM    US  UA Cord Doppler Result Date: 12/03/2024 Table formatting from the original result was not included. Images from the original result were not included.  ..an [employer] of Ultrasound Medicine TECHNICAL SALES ENGINEER) accredited practice Center for [facility] @ [facility] [address] [place] [address] Ordering Provider: Ozan, Jennifer, DO FOLLOW UP SONOGRAM Shalisa Mcquade is in the office for a follow up sonogram for BPP/cord dopplers. She is a 34 y.o. year old G3P0111 with Estimated Date of Delivery: 12/31/24 by early ultrasound now at  [redacted]w[redacted]d weeks gestation. 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 GESTATION: SINGLETON PRESENTATION: cephalic FETAL ACTIVITY:          Heart rate         136 bpm          The fetus is active. AMNIOTIC FLUID: The amniotic fluid volume is  normal, AFI = 18.8 cm, MVP = 5.7 cm. PLACENTA LOCALIZATION:  anterior GRADE 2 CERVIX: Not seen ADNEXA: Not seen GESTATIONAL AGE AND  BIOMETRICS: Gestational criteria: Estimated Date of Delivery: 12/31/24 by early ultrasound now at [redacted]w[redacted]d Previous Scans:7 BIOPHYSICAL PROFILE:                                                                                                      COMMENTS GROSS BODY MOVEMENT                 2  TONE                2  RESPIRATIONS                2  AMNIOTIC FLUID                2                                                          SCORE:  8/8 (Note: NST was not performed as part of this antepartum testing)  DOPPLER FLOW STUDIES: UMBILICAL ARTERY RI RATIOS: RI: 0.47, 0.55,    0.51 23%  ANATOMICAL SURVEY                                                                            COMMENTS  CEREBRAL VENTRICLES yes normal  CHOROID PLEXUS yes normal  CEREBELLUM yes normal  CISTERNA MAGNA  Yes  normal   CAVUM SEPTI PELLUCIDI YES NORMAL                      4 CHAMBERED HEART yes Decreased resolution                      DIAPHRAGM yes normal  STOMACH yes normal  RENAL REGION yes normal  BLADDER yes normal          3 VESSEL CORD yes normal  SPINE yes normal          GENITALIA yes Previously seen female     SUSPECTED ABNORMALITIES:  no QUALITY OF SCAN: Poor resolution due to BMI/advanced GA TECHNICIAN COMMENTS: US : GA = 36 weeks Single active fetus, cephalic, FHR = 136 bpm, anterior pl, gr2, AFI = 18.8 cm, MVP = 5.7 cm, BPP = 8/8, RI: 0.47, 0.55,   0.51 23% A copy of this report including all images has been saved and backed up to a second source for retrieval if needed. All measures and details of the anatomical scan, placentation, fluid volume and pelvic anatomy are contained in that report. Sharlet GORMAN Fair 12/03/2024 9:05 AM Clinical Impression and recommendations: I have reviewed the sonogram results above, combined with the patient's current clinical course, below are my impressions and any appropriate recommendations for management based on the sonographic findings. 1.  [id number] Estimated Date of Delivery: 12/31/24 by serial sonographic evaluations 2.  Fetal sonographic surveillance findings: a). Normal fluid volume b). Normal antepartum fetal assessment with BPP 8/8 c). Normal fetal Doppler ratios with consistent diastolic flow:  23% 3.  Normal general sonographic findings Recommend continued prenatal evaluations and care based on this sonogram and as clinically indicated from the patient's clinical course. Vonn VEAR Inch 12/03/2024 9:14 AM     MAU Course  Procedures  Lab Orders         CBC         Comprehensive metabolic panel         Protein / creatinine ratio, urine         Urinalysis, Routine w reflex microscopic -Urine, Clean Catch    No orders of the defined types were placed in this  encounter.  Imaging Orders  No imaging studies ordered today  MDM Moderate (Level 3-4)  Assessment and Plan  Supervision of high risk pregnancy, antepartum  Previous cesarean section  Chronic hypertension with superimposed pre-eclampsia  [redacted] weeks gestation of pregnancy   #FWB: NST: Reactive  Betsey Sossamon is a 34 y.o. at [redacted]w[redacted]d who presents for single elevated blood pressure reading at home.   -In a patient with a history of chronic hypertension with superimposed preeclampsia. -No right upper quadrant pain, vision changes, headache, shortness of breath, chest pain. - Had 1 severe range blood pressure in the MAU which was 178/96 that came down without medications.  At the time of that blood pressure reading, patient stated that she was very anxious.  Rest of her blood pressure readings in the MAU have been normal for her. - CMP, CBC without evidence of endorgan damage. - PCR 0.9  -Stable for discharge home - No evidence of progression to severe preeclampsia at this time. - [facility], [facility]/2024.  Message sent to the office to have her blood pressure taken as well. - All questions answered, anticipatory guidance, and detailed preeclampsia precautions provided.     Rakeisha Nyce L Dejia Ebron, MD/MHA 12/03/2024 11:24 PM  Allergies as of 12/03/2024       Reactions   Corticosteroids Palpitations, Other (See Comments)   Psychological reaction   Sertraline Other (See Comments)   Worsened anxiety, hyperhidrosis   A[employer] [[employer]ons Baby First Touch]    Sever hives and tongue swelled   Amoxicillin Other (See Comments)   Yeast infection        Medication List     TAKE these medications    aspirin  81 MG chewable tablet Chew 2 tablets (162 mg total) by mouth daily.   Blood Pressure Kit Kit Arm circumference- 14.75inches  Ht: 62ft 8 Weight 279lbs  Address- [address]  [place] [address]   Medicaid  #[id number]   Cholecalciferol 125 MCG (5000 UT) Tabs Take 1 tablet by mouth daily.   Easy Max Blood Glucose Test test strip Generic drug: glucose blood Check blood sugar four times daily   famotidine  20 MG tablet Commonly known as: Pepcid  Take 1 tablet (20 mg total) by mouth 2 (two) times daily.   metFORMIN  500 MG tablet Commonly known as: GLUCOPHAGE  Take 2 tablets (1,000 mg total) by mouth 2 (two) times daily with a meal.   NIFEdipine  60 MG 24 hr tablet Commonly known as: Procardia  XL Take 1 tablet (60 mg total) by mouth daily.   pantoprazole  40 MG tablet Commonly known as: PROTONIX  1 tablet by mouth once daily   TRUEplus Lancets 33G Misc 1 Device by Does not apply route 4 (four) times daily. Check blood sugar four times daily           [1]  Allergies Allergen Reactions   Corticosteroids Palpitations and Other (See Comments)    Psychological reaction   Sertraline Other (See Comments)    Worsened anxiety, hyperhidrosis   Aveeno [employer] [[employer] Baby First Touch]     Sever hives and tongue swelled   Amoxicillin Other (See Comments)    Yeast infection  [2]  No current facility-administered medications on file prior to encounter.   Current Outpatient Medications on File Prior to Encounter  Medication Sig Dispense Refill   aspirin  81 MG chewable tablet Chew 2 tablets (162 mg total) by mouth daily. 60 tablet 7   Blood Pressure Monitoring (BLOOD PRESSURE KIT) KIT Arm circumference- 14.75inches  Ht:  27ft 8 Weight 279lbs  Address- [address]  [place] [address]   Medicaid #[id number]     Cholecalciferol 125 MCG (5000 UT) TABS Take 1 tablet by mouth daily.     famotidine  (PEPCID ) 20 MG tablet Take 1 tablet (20 mg total) by mouth 2 (two) times daily. 60 tablet 11   glucose blood (EASY MAX BLOOD GLUCOSE TEST) test strip Check blood sugar four times daily 100 each 12   metFORMIN  (GLUCOPHAGE ) 500 MG tablet Take 2 tablets (1,000 mg total) by mouth 2 (two)  times daily with a meal. 120 tablet 11   NIFEdipine  (PROCARDIA  XL) 60 MG 24 hr tablet Take 1 tablet (60 mg total) by mouth daily. 30 tablet 1   pantoprazole  (PROTONIX ) 40 MG tablet 1 tablet by mouth once daily 30 tablet 6   TRUEplus Lancets 33G MISC 1 Device by Does not apply route 4 (four) times daily. Check blood sugar four times daily 100 each 12   "

## 2024-12-04 LAB — CERVICOVAGINAL ANCILLARY ONLY
Chlamydia: NEGATIVE
Comment: NEGATIVE
Comment: NORMAL
Neisseria Gonorrhea: NEGATIVE

## 2024-12-06 ENCOUNTER — Ambulatory Visit: Admitting: *Deleted

## 2024-12-06 VITALS — BP 149/96 | HR 86

## 2024-12-06 DIAGNOSIS — O0990 Supervision of high risk pregnancy, unspecified, unspecified trimester: Secondary | ICD-10-CM

## 2024-12-06 DIAGNOSIS — O10913 Unspecified pre-existing hypertension complicating pregnancy, third trimester: Secondary | ICD-10-CM

## 2024-12-06 DIAGNOSIS — Z3A36 36 weeks gestation of pregnancy: Secondary | ICD-10-CM

## 2024-12-06 DIAGNOSIS — O119 Pre-existing hypertension with pre-eclampsia, unspecified trimester: Secondary | ICD-10-CM

## 2024-12-06 NOTE — Progress Notes (Signed)
" ° °  NURSE VISIT- NST  SUBJECTIVE:  Mande Auvil is a 34 y.o. [phone] female at [redacted]w[redacted]d, here for a NST for pregnancy complicated by [facility].  She reports active fetal movement, contractions: none, vaginal bleeding: none, membranes: intact.   OBJECTIVE:  BP (!) 149/96   Pulse 86   LMP  (LMP Unknown)   Appears well, no apparent distress  No results found for this or any previous visit (from the past 24 hours).  NST: FHR baseline 135 bpm, Variability: moderate, Accelerations:present, Decelerations:  Absent= Cat 1/reactive Toco: none   ASSESSMENT: [id number] at [redacted]w[redacted]d with CHTN NST reactive  PLAN: EFM strip reviewed by Dr. Ozan   Recommendations: keep next appointment as scheduled    Alan LITTIE Fischer  12/06/2024 9:50 AM  "

## 2024-12-07 LAB — CULTURE, BETA STREP (GROUP B ONLY)

## 2024-12-09 ENCOUNTER — Encounter: Payer: Self-pay | Admitting: Obstetrics and Gynecology

## 2024-12-09 DIAGNOSIS — I809 Phlebitis and thrombophlebitis of unspecified site: Secondary | ICD-10-CM | POA: Insufficient documentation

## 2024-12-09 DIAGNOSIS — O1400 Mild to moderate pre-eclampsia, unspecified trimester: Secondary | ICD-10-CM | POA: Insufficient documentation

## 2024-12-10 ENCOUNTER — Other Ambulatory Visit: Payer: Self-pay

## 2024-12-10 ENCOUNTER — Encounter (HOSPITAL_COMMUNITY): Payer: Self-pay | Admitting: Family Medicine

## 2024-12-10 ENCOUNTER — Inpatient Hospital Stay (HOSPITAL_COMMUNITY)
Admission: RE | Admit: 2024-12-10 | Discharge: 2024-12-13 | DRG: 788 | Disposition: A | Discharge status: Home / Self Care | Attending: Obstetrics and Gynecology | Admitting: Obstetrics and Gynecology

## 2024-12-10 ENCOUNTER — Encounter (HOSPITAL_COMMUNITY): Admission: RE | Disposition: A | Payer: Self-pay | Source: Home / Self Care | Attending: Obstetrics and Gynecology

## 2024-12-10 ENCOUNTER — Other Ambulatory Visit

## 2024-12-10 ENCOUNTER — Encounter: Admitting: Obstetrics & Gynecology

## 2024-12-10 ENCOUNTER — Inpatient Hospital Stay (HOSPITAL_COMMUNITY): Admitting: Anesthesiology

## 2024-12-10 ENCOUNTER — Inpatient Hospital Stay (HOSPITAL_COMMUNITY)

## 2024-12-10 DIAGNOSIS — O1092 Unspecified pre-existing hypertension complicating childbirth: Secondary | ICD-10-CM | POA: Diagnosis present

## 2024-12-10 DIAGNOSIS — O99214 Obesity complicating childbirth: Secondary | ICD-10-CM | POA: Diagnosis present

## 2024-12-10 DIAGNOSIS — O114 Pre-existing hypertension with pre-eclampsia, complicating childbirth: Principal | ICD-10-CM | POA: Diagnosis present

## 2024-12-10 DIAGNOSIS — O9902 Anemia complicating childbirth: Secondary | ICD-10-CM | POA: Diagnosis present

## 2024-12-10 DIAGNOSIS — O2412 Pre-existing diabetes mellitus, type 2, in childbirth: Secondary | ICD-10-CM | POA: Diagnosis present

## 2024-12-10 DIAGNOSIS — Z7984 Long term (current) use of oral hypoglycemic drugs: Secondary | ICD-10-CM

## 2024-12-10 DIAGNOSIS — O9962 Diseases of the digestive system complicating childbirth: Secondary | ICD-10-CM | POA: Diagnosis present

## 2024-12-10 DIAGNOSIS — O34211 Maternal care for low transverse scar from previous cesarean delivery: Secondary | ICD-10-CM | POA: Diagnosis present

## 2024-12-10 DIAGNOSIS — E119 Type 2 diabetes mellitus without complications: Secondary | ICD-10-CM | POA: Diagnosis present

## 2024-12-10 DIAGNOSIS — D509 Iron deficiency anemia, unspecified: Principal | ICD-10-CM

## 2024-12-10 DIAGNOSIS — Z833 Family history of diabetes mellitus: Secondary | ICD-10-CM | POA: Diagnosis not present

## 2024-12-10 DIAGNOSIS — Z8249 Family history of ischemic heart disease and other diseases of the circulatory system: Secondary | ICD-10-CM | POA: Diagnosis not present

## 2024-12-10 DIAGNOSIS — Z87891 Personal history of nicotine dependence: Secondary | ICD-10-CM

## 2024-12-10 DIAGNOSIS — Z8672 Personal history of thrombophlebitis: Secondary | ICD-10-CM | POA: Diagnosis not present

## 2024-12-10 DIAGNOSIS — Z3A37 37 weeks gestation of pregnancy: Secondary | ICD-10-CM

## 2024-12-10 DIAGNOSIS — Z7982 Long term (current) use of aspirin: Secondary | ICD-10-CM

## 2024-12-10 DIAGNOSIS — E66813 Obesity, class 3: Secondary | ICD-10-CM | POA: Diagnosis present

## 2024-12-10 DIAGNOSIS — K219 Gastro-esophageal reflux disease without esophagitis: Secondary | ICD-10-CM | POA: Diagnosis present

## 2024-12-10 DIAGNOSIS — O1413 Severe pre-eclampsia, third trimester: Secondary | ICD-10-CM

## 2024-12-10 DIAGNOSIS — Z880 Allergy status to penicillin: Secondary | ICD-10-CM

## 2024-12-10 DIAGNOSIS — O1404 Mild to moderate pre-eclampsia, complicating childbirth: Secondary | ICD-10-CM | POA: Diagnosis present

## 2024-12-10 DIAGNOSIS — O24319 Unspecified pre-existing diabetes mellitus in pregnancy, unspecified trimester: Secondary | ICD-10-CM

## 2024-12-10 DIAGNOSIS — Z79899 Other long term (current) drug therapy: Secondary | ICD-10-CM

## 2024-12-10 DIAGNOSIS — O1410 Severe pre-eclampsia, unspecified trimester: Principal | ICD-10-CM | POA: Diagnosis present

## 2024-12-10 LAB — COMPREHENSIVE METABOLIC PANEL WITH GFR
ALT: 8 U/L (ref 0–44)
ALT: 8 U/L (ref 0–44)
AST: 17 U/L (ref 15–41)
AST: 23 U/L (ref 15–41)
Albumin: 3.1 g/dL — ABNORMAL LOW (ref 3.5–5.0)
Albumin: 3.1 g/dL — ABNORMAL LOW (ref 3.5–5.0)
Alkaline Phosphatase: 134 U/L — ABNORMAL HIGH (ref 38–126)
Alkaline Phosphatase: 132 U/L — ABNORMAL HIGH (ref 38–126)
Anion gap: 12 (ref 5–15)
Anion gap: 12 (ref 5–15)
BUN: 9 mg/dL (ref 6–20)
BUN: 11 mg/dL (ref 6–20)
CO2: 21 mmol/L — ABNORMAL LOW (ref 22–32)
CO2: 20 mmol/L — ABNORMAL LOW (ref 22–32)
Calcium: 9.3 mg/dL (ref 8.9–10.3)
Calcium: 8.2 mg/dL — ABNORMAL LOW (ref 8.9–10.3)
Chloride: 106 mmol/L (ref 98–111)
Chloride: 106 mmol/L (ref 98–111)
Creatinine, Ser: 0.59 mg/dL (ref 0.44–1.00)
Creatinine, Ser: 0.64 mg/dL (ref 0.44–1.00)
GFR, Estimated: >60 mL/min
GFR, Estimated: >60 mL/min
Glucose, Bld: 87 mg/dL (ref 70–99)
Glucose, Bld: 190 mg/dL — ABNORMAL HIGH (ref 70–99)
Potassium: 4 mmol/L (ref 3.5–5.1)
Potassium: 3.9 mmol/L (ref 3.5–5.1)
Sodium: 139 mmol/L (ref 135–145)
Sodium: 137 mmol/L (ref 135–145)
Total Bilirubin: 0.2 mg/dL (ref 0.0–1.2)
Total Bilirubin: <0.2 mg/dL (ref 0.0–1.2)
Total Protein: 6.3 g/dL — ABNORMAL LOW (ref 6.5–8.1)
Total Protein: 6.1 g/dL — ABNORMAL LOW (ref 6.5–8.1)

## 2024-12-10 LAB — CBC
HCT: 32 % — ABNORMAL LOW (ref 36.0–46.0)
HCT: 29.8 % — ABNORMAL LOW (ref 36.0–46.0)
Hemoglobin: 10 g/dL — ABNORMAL LOW (ref 12.0–15.0)
Hemoglobin: 9.5 g/dL — ABNORMAL LOW (ref 12.0–15.0)
MCH: 23.5 pg — ABNORMAL LOW (ref 26.0–34.0)
MCH: 24.1 pg — ABNORMAL LOW (ref 26.0–34.0)
MCHC: 31.3 g/dL (ref 30.0–36.0)
MCHC: 31.9 g/dL (ref 30.0–36.0)
MCV: 75.1 fL — ABNORMAL LOW (ref 80.0–100.0)
MCV: 75.6 fL — ABNORMAL LOW (ref 80.0–100.0)
Platelets: 218 K/uL (ref 150–400)
Platelets: 260 K/uL (ref 150–400)
RBC: 4.26 MIL/uL (ref 3.87–5.11)
RBC: 3.94 MIL/uL (ref 3.87–5.11)
RDW: 17.2 % — ABNORMAL HIGH (ref 11.5–15.5)
RDW: 17.3 % — ABNORMAL HIGH (ref 11.5–15.5)
WBC: 6.8 K/uL (ref 4.0–10.5)
WBC: 10.3 K/uL (ref 4.0–10.5)
nRBC: 0 % (ref 0.0–0.2)
nRBC: 0 % (ref 0.0–0.2)

## 2024-12-10 LAB — GLUCOSE, CAPILLARY
Glucose-Capillary: 86 mg/dL (ref 70–99)
Glucose-Capillary: 103 mg/dL — ABNORMAL HIGH (ref 70–99)
Glucose-Capillary: 122 mg/dL — ABNORMAL HIGH (ref 70–99)
Glucose-Capillary: 148 mg/dL — ABNORMAL HIGH (ref 70–99)

## 2024-12-10 LAB — TYPE AND SCREEN
ABO/RH(D): O POS
Antibody Screen: NEGATIVE

## 2024-12-10 LAB — SYPHILIS: RPR W/REFLEX TO RPR TITER AND TREPONEMAL ANTIBODIES, TRADITIONAL SCREENING AND DIAGNOSIS ALGORITHM: RPR Ser Ql: NONREACTIVE

## 2024-12-10 LAB — HEMOGLOBIN A1C
Hgb A1c MFr Bld: 5.5 % (ref 4.8–5.6)
Mean Plasma Glucose: 111.15 mg/dL

## 2024-12-10 LAB — PROTEIN / CREATININE RATIO, URINE
Creatinine, Urine: 154 mg/dL
Protein Creatinine Ratio: 1.5 mg/mg — ABNORMAL HIGH
Total Protein, Urine: 237 mg/dL

## 2024-12-10 MED ORDER — CEFAZOLIN SODIUM-DEXTROSE 3-4 GM/150ML-% IV SOLN
3.0000 g | INTRAVENOUS | Status: AC
  Administered 2024-12-10: 3 g via INTRAVENOUS

## 2024-12-10 MED ORDER — MENTHOL 3 MG MT LOZG: 1.0000 | LOZENGE | OROMUCOSAL | Status: DC | PRN

## 2024-12-10 MED ORDER — MAGNESIUM SULFATE 40 GM/1000ML IV SOLN
2.0000 g/h | INTRAVENOUS | Status: AC
  Administered 2024-12-10: 2 g/h via INTRAVENOUS
  Filled 2024-12-10 (×2): qty 1000

## 2024-12-10 MED ORDER — LACTATED RINGERS IV SOLN: INTRAVENOUS | Status: DC

## 2024-12-10 MED ORDER — NIFEDIPINE 10 MG PO CAPS: 20.0000 mg | ORAL_CAPSULE | ORAL | Status: DC | PRN

## 2024-12-10 MED ORDER — SODIUM CHLORIDE 0.9% FLUSH: 3.0000 mL | INTRAVENOUS | Status: DC | PRN

## 2024-12-10 MED ORDER — OXYTOCIN BOLUS FROM INFUSION: 333.0000 mL | Freq: Once | INTRAVENOUS | Status: DC

## 2024-12-10 MED ORDER — STERILE WATER FOR IRRIGATION IR SOLN
Status: DC | PRN
  Administered 2024-12-10: 1000 mL

## 2024-12-10 MED ORDER — ONDANSETRON HCL 4 MG/2ML IJ SOLN: 4.0000 mg | Freq: Three times a day (TID) | INTRAMUSCULAR | Status: DC | PRN

## 2024-12-10 MED ORDER — HYDRALAZINE HCL 20 MG/ML IJ SOLN: 10.0000 mg | INTRAMUSCULAR | Status: DC | PRN

## 2024-12-10 MED ORDER — ENOXAPARIN SODIUM 80 MG/0.8ML IJ SOSY
70.0000 mg | PREFILLED_SYRINGE | INTRAMUSCULAR | Status: DC
  Filled 2024-12-10 (×3): qty 0.8

## 2024-12-10 MED ORDER — INSULIN ASPART 100 UNIT/ML IJ SOLN
0.0000 [IU] | Freq: Every day | INTRAMUSCULAR | Status: DC
  Filled 2024-12-10: qty 1

## 2024-12-10 MED ORDER — HYDRALAZINE HCL 20 MG/ML IJ SOLN
10.0000 mg | INTRAMUSCULAR | Status: DC | PRN
  Administered 2024-12-10: 10 mg via INTRAVENOUS

## 2024-12-10 MED ORDER — LACTATED RINGERS IV SOLN: 500.0000 mL | INTRAVENOUS | Status: DC | PRN

## 2024-12-10 MED ORDER — SOD CITRATE-CITRIC ACID 500-334 MG/5ML PO SOLN: 30.0000 mL | ORAL | Status: DC

## 2024-12-10 MED ORDER — SOD CITRATE-CITRIC ACID 500-334 MG/5ML PO SOLN
30.0000 mL | ORAL | Status: DC | PRN
  Administered 2024-12-10: 30 mL via ORAL
  Filled 2024-12-10: qty 30

## 2024-12-10 MED ORDER — ACETAMINOPHEN 500 MG PO TABS
1000.0000 mg | ORAL_TABLET | Freq: Four times a day (QID) | ORAL | Status: AC
  Administered 2024-12-10 – 2024-12-11 (×4): 1000 mg via ORAL
  Filled 2024-12-10 (×4): qty 2

## 2024-12-10 MED ORDER — LABETALOL HCL 5 MG/ML IV SOLN: 40.0000 mg | INTRAVENOUS | Status: DC | PRN

## 2024-12-10 MED ORDER — DIBUCAINE (PERIANAL) 1 % EX OINT: 1.0000 | TOPICAL_OINTMENT | CUTANEOUS | Status: DC | PRN

## 2024-12-10 MED ORDER — PRENATAL MULTIVITAMIN CH
1.0000 | ORAL_TABLET | Freq: Every day | ORAL | Status: DC
  Administered 2024-12-10 – 2024-12-12 (×3): 1 via ORAL
  Filled 2024-12-10 (×3): qty 1

## 2024-12-10 MED ORDER — LABETALOL HCL 5 MG/ML IV SOLN
40.0000 mg | INTRAVENOUS | Status: DC | PRN
  Administered 2024-12-10: 40 mg via INTRAVENOUS
  Filled 2024-12-10: qty 8

## 2024-12-10 MED ORDER — ONDANSETRON HCL 4 MG/2ML IJ SOLN
INTRAMUSCULAR | Status: DC | PRN
  Administered 2024-12-10: 4 mg via INTRAVENOUS

## 2024-12-10 MED ORDER — SENNOSIDES-DOCUSATE SODIUM 8.6-50 MG PO TABS
2.0000 | ORAL_TABLET | Freq: Every evening | ORAL | Status: DC | PRN
  Administered 2024-12-11: 2 via ORAL
  Filled 2024-12-10: qty 2

## 2024-12-10 MED ORDER — SCOPOLAMINE 1 MG/3DAYS TD PT72: 1.0000 | MEDICATED_PATCH | Freq: Once | TRANSDERMAL | Status: DC

## 2024-12-10 MED ORDER — LABETALOL HCL 5 MG/ML IV SOLN: 80.0000 mg | INTRAVENOUS | Status: DC | PRN

## 2024-12-10 MED ORDER — FAMOTIDINE IN NACL 20-0.9 MG/50ML-% IV SOLN
INTRAVENOUS | Status: AC
  Filled 2024-12-10: qty 50

## 2024-12-10 MED ORDER — INSULIN ASPART 100 UNIT/ML IJ SOLN: 0.0000 [IU] | Freq: Three times a day (TID) | INTRAMUSCULAR | Status: DC

## 2024-12-10 MED ORDER — MORPHINE SULFATE (PF) 0.5 MG/ML IJ SOLN
INTRAMUSCULAR | Status: DC | PRN
  Administered 2024-12-10: 150 ug via INTRATHECAL

## 2024-12-10 MED ORDER — ENALAPRIL MALEATE 2.5 MG PO TABS
2.5000 mg | ORAL_TABLET | Freq: Every day | ORAL | Status: DC
  Administered 2024-12-10 – 2024-12-12 (×3): 2.5 mg via ORAL
  Filled 2024-12-10 (×3): qty 1

## 2024-12-10 MED ORDER — DIPHENHYDRAMINE HCL 25 MG PO CAPS: 25.0000 mg | ORAL_CAPSULE | ORAL | Status: DC | PRN

## 2024-12-10 MED ORDER — HYDROXYZINE HCL 50 MG PO TABS: 50.0000 mg | ORAL_TABLET | Freq: Four times a day (QID) | ORAL | Status: DC | PRN

## 2024-12-10 MED ORDER — LABETALOL HCL 5 MG/ML IV SOLN: 20.0000 mg | INTRAVENOUS | Status: DC | PRN

## 2024-12-10 MED ORDER — DIPHENHYDRAMINE HCL 25 MG PO CAPS: 25.0000 mg | ORAL_CAPSULE | Freq: Four times a day (QID) | ORAL | Status: DC | PRN

## 2024-12-10 MED ORDER — OXYCODONE HCL 5 MG PO TABS
5.0000 mg | ORAL_TABLET | ORAL | Status: DC | PRN
  Administered 2024-12-10 – 2024-12-11 (×3): 5 mg via ORAL
  Administered 2024-12-11 (×3): 10 mg via ORAL
  Administered 2024-12-12 (×5): 5 mg via ORAL
  Administered 2024-12-13: 10 mg via ORAL
  Filled 2024-12-10: qty 2
  Filled 2024-12-10 (×2): qty 1
  Filled 2024-12-10 (×2): qty 2
  Filled 2024-12-10 (×2): qty 1
  Filled 2024-12-10 (×3): qty 2
  Filled 2024-12-10 (×2): qty 1

## 2024-12-10 MED ORDER — OXYTOCIN-SODIUM CHLORIDE 30-0.9 UT/500ML-% IV SOLN
1.0000 m[IU]/min | INTRAVENOUS | Status: DC
  Filled 2024-12-10: qty 500

## 2024-12-10 MED ORDER — ONDANSETRON HCL 4 MG/2ML IJ SOLN: 4.0000 mg | Freq: Four times a day (QID) | INTRAMUSCULAR | Status: DC | PRN

## 2024-12-10 MED ORDER — TERBUTALINE SULFATE 1 MG/ML IJ SOLN: 0.2500 mg | Freq: Once | INTRAMUSCULAR | Status: DC | PRN

## 2024-12-10 MED ORDER — MAGNESIUM SULFATE BOLUS VIA INFUSION
4.0000 g | Freq: Once | INTRAVENOUS | Status: AC
  Administered 2024-12-10: 4 g via INTRAVENOUS
  Filled 2024-12-10: qty 1000

## 2024-12-10 MED ORDER — MORPHINE SULFATE (PF) 0.5 MG/ML IJ SOLN
INTRAMUSCULAR | Status: AC
  Filled 2024-12-10: qty 10

## 2024-12-10 MED ORDER — TRANEXAMIC ACID-NACL 1000-0.7 MG/100ML-% IV SOLN
1000.0000 mg | Freq: Once | INTRAVENOUS | Status: AC
  Administered 2024-12-10: 1000 mg via INTRAVENOUS

## 2024-12-10 MED ORDER — ACETAMINOPHEN 325 MG PO TABS: 650.0000 mg | ORAL_TABLET | ORAL | Status: DC | PRN

## 2024-12-10 MED ORDER — LACTATED RINGERS IV SOLN: INTRAVENOUS | Status: AC

## 2024-12-10 MED ORDER — HYDRALAZINE HCL 20 MG/ML IJ SOLN
5.0000 mg | INTRAMUSCULAR | Status: DC | PRN
  Administered 2024-12-10: 5 mg via INTRAVENOUS
  Filled 2024-12-10: qty 1

## 2024-12-10 MED ORDER — ACETAMINOPHEN 10 MG/ML IV SOLN
INTRAVENOUS | Status: DC | PRN
  Administered 2024-12-10: 1000 mg via INTRAVENOUS

## 2024-12-10 MED ORDER — KETOROLAC TROMETHAMINE 30 MG/ML IJ SOLN: 30.0000 mg | Freq: Four times a day (QID) | INTRAMUSCULAR | Status: DC | PRN

## 2024-12-10 MED ORDER — SIMETHICONE 80 MG PO CHEW
80.0000 mg | CHEWABLE_TABLET | ORAL | Status: DC | PRN
  Administered 2024-12-11 (×2): 80 mg via ORAL
  Filled 2024-12-10 (×2): qty 1

## 2024-12-10 MED ORDER — METFORMIN HCL 500 MG PO TABS
500.0000 mg | ORAL_TABLET | Freq: Two times a day (BID) | ORAL | Status: DC
  Administered 2024-12-10 – 2024-12-13 (×6): 500 mg via ORAL
  Filled 2024-12-10 (×6): qty 1

## 2024-12-10 MED ORDER — NALOXONE HCL 0.4 MG/ML IJ SOLN: 0.4000 mg | INTRAMUSCULAR | Status: DC | PRN

## 2024-12-10 MED ORDER — POTASSIUM CHLORIDE CRYS ER 20 MEQ PO TBCR
20.0000 meq | EXTENDED_RELEASE_TABLET | Freq: Every day | ORAL | Status: DC
  Administered 2024-12-10 – 2024-12-13 (×4): 20 meq via ORAL
  Filled 2024-12-10 (×4): qty 1

## 2024-12-10 MED ORDER — OXYTOCIN-SODIUM CHLORIDE 30-0.9 UT/500ML-% IV SOLN: 2.5000 [IU]/h | INTRAVENOUS | Status: AC

## 2024-12-10 MED ORDER — COCONUT OIL OIL: 1.0000 | TOPICAL_OIL | Status: DC | PRN

## 2024-12-10 MED ORDER — WITCH HAZEL-GLYCERIN EX PADS: 1.0000 | MEDICATED_PAD | CUTANEOUS | Status: DC | PRN

## 2024-12-10 MED ORDER — OXYTOCIN-SODIUM CHLORIDE 30-0.9 UT/500ML-% IV SOLN: 2.5000 [IU]/h | INTRAVENOUS | Status: DC

## 2024-12-10 MED ORDER — OXYCODONE-ACETAMINOPHEN 5-325 MG PO TABS: 2.0000 | ORAL_TABLET | ORAL | Status: DC | PRN

## 2024-12-10 MED ORDER — OXYCODONE-ACETAMINOPHEN 5-325 MG PO TABS: 1.0000 | ORAL_TABLET | ORAL | Status: DC | PRN

## 2024-12-10 MED ORDER — DIPHENHYDRAMINE HCL 50 MG/ML IJ SOLN: 12.5000 mg | INTRAMUSCULAR | Status: DC | PRN

## 2024-12-10 MED ORDER — NIFEDIPINE ER OSMOTIC RELEASE 60 MG PO TB24
60.0000 mg | ORAL_TABLET | Freq: Two times a day (BID) | ORAL | Status: DC
  Administered 2024-12-10: 60 mg via ORAL
  Filled 2024-12-10: qty 1

## 2024-12-10 MED ORDER — FAMOTIDINE IN NACL 20-0.9 MG/50ML-% IV SOLN
INTRAVENOUS | Status: DC | PRN
  Administered 2024-12-10: 20 mg via INTRAVENOUS

## 2024-12-10 MED ORDER — LIDOCAINE HCL (PF) 1 % IJ SOLN: 30.0000 mL | INTRAMUSCULAR | Status: DC | PRN

## 2024-12-10 MED ORDER — FUROSEMIDE 20 MG PO TABS
20.0000 mg | ORAL_TABLET | Freq: Every day | ORAL | Status: DC
  Administered 2024-12-10 – 2024-12-13 (×4): 20 mg via ORAL
  Filled 2024-12-10 (×4): qty 1

## 2024-12-10 MED ORDER — FLEET ENEMA RE ENEM: 1.0000 | ENEMA | RECTAL | Status: DC | PRN

## 2024-12-10 MED ORDER — PANTOPRAZOLE SODIUM 20 MG PO TBEC
20.0000 mg | DELAYED_RELEASE_TABLET | Freq: Every day | ORAL | Status: DC
  Administered 2024-12-10 – 2024-12-13 (×4): 20 mg via ORAL
  Filled 2024-12-10 (×4): qty 1

## 2024-12-10 MED ORDER — FENTANYL CITRATE (PF) 100 MCG/2ML IJ SOLN
INTRAMUSCULAR | Status: DC | PRN
  Administered 2024-12-10: 15 ug via INTRATHECAL

## 2024-12-10 MED ORDER — DEXAMETHASONE SOD PHOSPHATE PF 10 MG/ML IJ SOLN
INTRAMUSCULAR | Status: DC | PRN
  Administered 2024-12-10: 5 mg via INTRAVENOUS

## 2024-12-10 MED ORDER — NIFEDIPINE 10 MG PO CAPS: 10.0000 mg | ORAL_CAPSULE | ORAL | Status: DC | PRN

## 2024-12-10 MED ORDER — NALOXONE HCL 4 MG/10ML IJ SOLN: 1.0000 ug/kg/h | INTRAVENOUS | Status: DC | PRN

## 2024-12-10 MED ORDER — GABAPENTIN 300 MG PO CAPS
300.0000 mg | ORAL_CAPSULE | Freq: Two times a day (BID) | ORAL | Status: DC
  Administered 2024-12-10 – 2024-12-13 (×7): 300 mg via ORAL
  Filled 2024-12-10 (×7): qty 1

## 2024-12-10 MED ORDER — SIMETHICONE 80 MG PO CHEW
80.0000 mg | CHEWABLE_TABLET | Freq: Three times a day (TID) | ORAL | Status: DC
  Administered 2024-12-10 – 2024-12-13 (×10): 80 mg via ORAL
  Filled 2024-12-10 (×10): qty 1

## 2024-12-10 MED ORDER — FENTANYL CITRATE (PF) 100 MCG/2ML IJ SOLN
INTRAMUSCULAR | Status: AC
  Filled 2024-12-10: qty 2

## 2024-12-10 MED ORDER — PHENYLEPHRINE 80 MCG/ML (10ML) SYRINGE FOR IV PUSH (FOR BLOOD PRESSURE SUPPORT)
PREFILLED_SYRINGE | INTRAVENOUS | Status: DC | PRN
  Administered 2024-12-10 (×2): 160 ug via INTRAVENOUS

## 2024-12-10 MED ORDER — FENTANYL CITRATE (PF) 100 MCG/2ML IJ SOLN: 100.0000 ug | INTRAMUSCULAR | Status: DC | PRN

## 2024-12-10 MED ORDER — IBUPROFEN 600 MG PO TABS
600.0000 mg | ORAL_TABLET | Freq: Four times a day (QID) | ORAL | Status: AC
  Administered 2024-12-10 – 2024-12-13 (×12): 600 mg via ORAL
  Filled 2024-12-10 (×12): qty 1

## 2024-12-10 MED ORDER — PHENYLEPHRINE HCL-NACL 20-0.9 MG/250ML-% IV SOLN
INTRAVENOUS | Status: DC | PRN
  Administered 2024-12-10: 60 ug/min via INTRAVENOUS

## 2024-12-10 MED ORDER — OXYTOCIN-SODIUM CHLORIDE 30-0.9 UT/500ML-% IV SOLN
INTRAVENOUS | Status: DC | PRN
  Administered 2024-12-10: 200 mL via INTRAVENOUS

## 2024-12-10 NOTE — Progress Notes (Signed)
 OB Note CBC and CMP wnl and PC ratio still positive. BPs coming down and EFM wnl. Patient stated that she would like a repeat c/s. R/b of c/s d/w her and she still desires to proceed. Last meal at 1900 and was a cheese sandwich. Can proceed when the OR is ready  Bebe Izell Raddle MD Attending Center for [employer] (Faculty Practice) 12/10/2024 Time: [phone]

## 2024-12-10 NOTE — Anesthesia Procedure Notes (Signed)
 Spinal  Patient location during procedure: OR Start time: 12/10/2024 2:59 AM End time: 12/10/2024 3:01 AM Reason for block: surgical anesthesia  Staffing Performed: anesthesiologist  Authorized by: Merla Almarie HERO, DO   Performed by: Merla Almarie HERO, DO  Preanesthetic Checklist Completed: patient identified, IV checked, risks and benefits discussed, surgical consent, monitors and equipment checked, pre-op evaluation and timeout performed Spinal Block Patient position: sitting Prep: DuraPrep and site prepped and draped Patient monitoring: cardiac monitor, continuous pulse ox and blood pressure Approach: midline Location: L3-4 Injection technique: single-shot Needle Needle type: Pencan  Needle gauge: 24 G Needle length: 9 cm Assessment Sensory level: T6 Events: CSF return  Additional Notes Functioning IV was confirmed and monitors were applied. Sterile prep and drape, including hand hygiene and sterile gloves were used. The patient was positioned and the spine was prepped. The skin was anesthetized with lidocaine .  Free flow of clear CSF was obtained prior to injecting local anesthetic into the CSF.  The spinal needle aspirated freely following injection.  The needle was carefully withdrawn.  The patient tolerated the procedure well.

## 2024-12-10 NOTE — Anesthesia Preprocedure Evaluation (Signed)
"                                    Anesthesia Evaluation  Patient identified by MRN, date of birth, ID band Patient awake    Reviewed: Allergy & Precautions, NPO status , Patient's Chart, lab work & pertinent test results  Airway Mallampati: III  TM Distance: >3 FB Neck ROM: Full    Dental no notable dental hx.    Pulmonary neg pulmonary ROS, former smoker   Pulmonary exam normal breath sounds clear to auscultation       Cardiovascular hypertension (preE w/ SF, hx cHTN), Pt. on medications Normal cardiovascular exam Rhythm:Regular Rate:Normal     Neuro/Psych  PSYCHIATRIC DISORDERS Anxiety Depression    negative neurological ROS     GI/Hepatic Neg liver ROS,GERD  Controlled and Medicated,,  Endo/Other  diabetes, Well Controlled, Type 2  Class 3 obesity (BMI 46)Last A1c 5.4  Renal/GU negative Renal ROS  negative genitourinary   Musculoskeletal negative musculoskeletal ROS (+)    Abdominal  (+) + obese  Peds negative pediatric ROS (+)  Hematology  (+) Blood dyscrasia, anemia Hb 10, plt 218   Anesthesia Other Findings   Reproductive/Obstetrics (+) Pregnancy Prior section 10/2023 PLTCS for failed 34wk severe pre-eclampsia IOL due to fetal decelerations at 5cm, with epidural- no issues Presented tonight for medical IOL for cHTN/hx severe preE and Bps on arrival 190-110; mag started, decision to go for section instead of laboring                              Anesthesia Physical Anesthesia Plan  ASA: 3  Anesthesia Plan: Spinal   Post-op Pain Management: Regional block, Toradol  IV (intra-op)* and Ofirmev  IV (intra-op)*   Induction:   PONV Risk Score and Plan: 3 and Ondansetron , Dexamethasone  and Treatment may vary due to age or medical condition  Airway Management Planned: Natural Airway and Nasal Cannula  Additional Equipment: None  Intra-op Plan:   Post-operative Plan:   Informed Consent: I have reviewed the  patients History and Physical, chart, labs and discussed the procedure including the risks, benefits and alternatives for the proposed anesthesia with the patient or authorized representative who has indicated his/her understanding and acceptance.       Plan Discussed with: CRNA  Anesthesia Plan Comments:         Anesthesia Quick Evaluation  "

## 2024-12-10 NOTE — H&P (Signed)
 Obstetrics Admission History & Physical  12/10/2024 - 1:18 AM Primary OBGYN: [facility]  Chief Complaint: IOL for mild pre-eclampsia  History of Present Illness  34 y.o. [id number] at [redacted]w[redacted]d, with the above CC. Pregnancy complicated by: BMI 40s, CHTN, DM2, 10/2023 PLTCS for failed 34wk severe pre-eclampsia IOL due to fetal decelerations at 5cm.  [name] states that she took her meds on time yesterday.   Review of Systems: as noted in the History of Present Illness.  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   Vitamin D deficiency 07/06/2021   History of severe pre-eclampsia 07/06/2021    PMHx:  Past Medical History:  Diagnosis Date   Anemia    Anxiety    Diabetes mellitus without complication (HCC)    GERD (gastroesophageal reflux disease)    Hidradenitis suppurativa    History of severe pre-eclampsia    Hypertension    PCOS (polycystic ovarian syndrome)    Vitamin D deficiency    PSHx:  Past Surgical History:  Procedure Laterality Date   BIOPSY  11/24/2022   Procedure: BIOPSY;  Surgeon: Cindie Carlin POUR, DO;  [facility];  Service: Endoscopy;;   CESAREAN SECTION     CHOLECYSTECTOMY     ESOPHAGOGASTRODUODENOSCOPY (EGD) WITH PROPOFOL  N/A 11/24/2022   Procedure: ESOPHAGOGASTRODUODENOSCOPY (EGD) WITH PROPOFOL ;  Surgeon: Cindie Carlin POUR, DO;  Location: [facility];  Service: Endoscopy;  Laterality: N/A;  10:30am, asa 3   Medications:  Medications Prior to Admission  Medication Sig Dispense Refill Last Dose/Taking   aspirin  81 MG chewable tablet Chew 2  tablets (162 mg total) by mouth daily. 60 tablet 7 12/09/2024   Cholecalciferol 125 MCG (5000 UT) TABS Take 1 tablet by mouth daily.   Past Month   NIFEdipine  (PROCARDIA  XL) 60 MG 24 hr tablet Take 1 tablet (60 mg total) by mouth daily. 30 tablet 1 12/09/2024   pantoprazole  (PROTONIX ) 40 MG tablet 1 tablet by mouth once daily 30 tablet 6 12/09/2024   Blood Pressure Monitoring (BLOOD PRESSURE KIT) KIT Arm circumference- 14.75inches  Ht: 18ft 8 Weight 279lbs  Address- [address]  [place] [address]   Medicaid #[id number]      famotidine  (PEPCID ) 20 MG tablet Take 1 tablet (20 mg total) by mouth 2 (two) times daily. 60 tablet 11    glucose blood (EASY MAX BLOOD GLUCOSE TEST) test strip Check blood sugar four times daily 100 each 12    metFORMIN  (GLUCOPHAGE ) 500 MG tablet Take 2 tablets (1,000 mg total) by mouth 2 (two) times daily with a meal. 120 tablet 11    TRUEplus Lancets 33G MISC 1 Device by Does not apply route 4 (four) times daily. Check blood sugar four times daily 100 each 12      Allergies: is allergic to corticosteroids, sertraline, aveeno baby bathtime solutions [johnsons baby first touch], and amoxicillin. OBHx:  OB History  Gravida Para Term Preterm AB Living  3 1 0 1 1 1   SAB IAB Ectopic Multiple Live Births  1 0 0 0 1    # Outcome Date GA Lbr Len/2nd Weight Sex Type Anes PTL Lv  3  Current           2 Preterm 11/11/23 [redacted]w[redacted]d   F CS-LTranv  Y LIV     Complications: Fetal Intolerance  1 SAB 12/04/21 [redacted]w[redacted]d                  FHx:  Family History  Problem Relation Age of Onset   Hypertension Mother    Diabetes Mother    Soc Hx:  Social History   Socioeconomic History   Marital status: Married    Spouse name: Not on file   Number of children: Not on file   Years of education: Not on file   Highest education level: Not on file  Occupational History   Not on file  Tobacco Use   Smoking status: Former    Current packs/day: 1.00    Types: Cigarettes    Passive  exposure: Past   Smokeless tobacco: Never  Vaping Use   Vaping status: Never Used  Substance and Sexual Activity   Alcohol use: No   Drug use: No   Sexual activity: Not Currently    Birth control/protection: None  Other Topics Concern   Not on file  Social History Narrative   Not on file   Social Drivers of Health   Tobacco Use: Medium Risk (12/10/2024)   Patient History    Smoking Tobacco Use: Former    Smokeless Tobacco Use: Never    Passive Exposure: Past  Physicist, Medical Strain: Low Risk (06/12/2024)   Overall Financial Resource Strain (CARDIA)    Difficulty of Paying Living Expenses: Not hard at all  Food Insecurity: No Food Insecurity (12/10/2024)   Epic    Worried About Programme Researcher, Broadcasting/film/video in the Last Year: Never true    Ran Out of Food in the Last Year: Never true  Transportation Needs: No Transportation Needs (12/10/2024)   Epic    Lack of Transportation (Medical): No    Lack of Transportation (Non-Medical): No  Physical Activity: Insufficiently Active (06/12/2024)   Exercise Vital Sign    Days of Exercise per Week: 3 days    Minutes of Exercise per Session: 30 min  Stress: No Stress Concern Present (06/12/2024)   [employer] of Occupational Health - Occupational Stress Questionnaire    Feeling of Stress: Only a little  Social Connections: Moderately Isolated (06/12/2024)   Social Connection and Isolation Panel    Frequency of Communication with Friends and Family: Three times a week    Frequency of Social Gatherings with Friends and Family: Twice a week    Attends Religious Services: Never    Database Administrator or Organizations: No    Attends Banker Meetings: Never    Marital Status: Married  Catering Manager Violence: Not At Risk (12/10/2024)   Epic    Fear of Current or Ex-Partner: No    Emotionally Abused: No    Physically Abused: No    Sexually Abused: No  Depression (PHQ2-9): Low Risk (06/12/2024)   Depression (PHQ2-9)    PHQ-2  Score: 0  Alcohol Screen: Low Risk (06/12/2024)   Alcohol Screen    Last Alcohol Screening Score (AUDIT): 0  Housing: Unknown (12/10/2024)   Epic    Unable to Pay for Housing in the Last Year: No    Number of Times Moved in the Last Year: Not on file    Homeless in the Last Year: No  Utilities: Not At Risk (12/10/2024)   Epic    Threatened  with loss of utilities: No  Health Literacy: Low Risk (02/26/2024)   Received from [facility] Literacy    How often do you need to have someone help you when you read instructions, pamphlets, or other written material from your doctor or pharmacy?: Never    Objective  Patient Vitals for the past 12 hrs:  BP Temp Temp src Pulse Resp  12/10/24 0114 (!) 176/95 -- -- -- --  12/10/24 0110 (!) 176/95 -- -- (!) 102 --  12/10/24 0100 (!) 168/82 98.3 F (36.8 C) Oral 91 18  12/10/24 0050 (!) 182/101 -- -- 89 --  12/10/24 0046 (!) 193/100 -- -- 90 --  12/10/24 0030 (!) 183/93 -- -- 87 --   EFM: 135 baseline, +accels, no decel, mod variability  Toco: quiet  General: Well nourished, well developed female in no acute distress.  Skin:  Warm and dry.  Cardiovascular: S1, S2 normal, no murmur, rub or gallop, regular rate and rhythm Respiratory:  Clear to auscultation bilateral. Normal respiratory effort Abdomen: obese, nttp Neuro/Psych:  Normal mood and affect.   Labs  Pending.   Radiology 1/6: ceph, afi 19, bpp 8/8.  12/16: efw 57%, 2212g  Perinatal info  ABO, Rh: O/Positive/-- (07/16 1041) Antibody: Negative (11/13 0812) Rubella: 1.17 (07/16 1041) RPR: Non Reactive (11/13 0812)  HBsAg: Negative (07/16 1041)  HIV: Non Reactive (11/13 9187)  HAD:Wzhjupcz/-- (01/06 0446)  Assessment & Plan   33 y.o. [id number] at [redacted]w[redacted]d with now severe pre-eclampsia; patient stable *Labor: Patient with severe range BPs and she states she took her procardia  60 qday at the usual time (9am on 1/12). I told her that I recommend we start Mg and give her IV  meds to help control her BPs. I told her that her chances of having a successful OLAC are low, with approximately 20-25% chance based on MFM VBAC calculator, with probably lower chance given she has severe pre-eclampsia now. I told her that she can still TOLAC as long as she's aware of risk of going through labor and needing a c-section, which is the worst outcome and more stressful for her and baby. Patient unsure about proceeding. I told her we'll start her on the severe pre-eclampsia meds, wait on her labs and scan for presentation and then come back and talk to her.  *h/o C-section: see above *Severe pre-eclampsia: starting Mg and IV hydral per protocol. F/u labs *DM2: f/u CBGs; pt on metformin  at home.  *GBS: negative *Analgesia: no current needs  Candice Izell Overcast MD Attending Center for [facility])

## 2024-12-10 NOTE — Lactation Note (Signed)
 This note was copied from a baby's chart. Lactation Consultation Note  Patient Name: Boy Arriyah Madej Unijb'd Date: 12/10/2024 Age:33 hours Reason for consult: Initial assessment;1st time breastfeeding;Early term 37-38.6wks;Maternal endocrine disorder DM, PCOS.  P2, 37 weeks. Baby sleeping skin to skin on mother's chest. Reviewed hand expression with no drops expressed.   Mother states she would like to breastfeed and formula feed and was open to pumping.  Baby recently consumed 20 ml of 20 kcal formula. Attempted latching.  Baby did not wake to feed.  Set up DEBP with 18 mm flanges and suggest mother pump q 3 hours for 15 min.  Encouraged offering the breast first and supplement after.  Maternal Data Has patient been taught Hand Expression?: Yes Does the patient have breastfeeding experience prior to this delivery?: No  Feeding Mother's Current Feeding Choice: Breast Milk and Formula Lactation Tools Discussed/Used Tools: Pump;Flanges Flange Size: 18 Breast pump type: Double-Electric Breast Pump;Manual Pump Education: Setup, frequency, and cleaning;Milk Storage Reason for Pumping: stimulation and supplementation Pumping frequency: q 3 hours for 15 min  Interventions Interventions: Breast feeding basics reviewed;Assisted with latch;Skin to skin;Hand express;DEBP;Hand pump;Education;LC Services brochure;CDC milk storage guidelines  Discharge Pump: Personal;Hands Free  Consult Status Consult Status: Follow-up Date: 12/11/24 Follow-up type: In-patient   Shannon Levorn Lemme  RN, IBCLC 12/10/2024, 1:11 PM

## 2024-12-10 NOTE — Discharge Instructions (Signed)
Continue to check your blood pressures twice a day Call the office for blood pressures that are consistently above 145 for the top number or 95 for the bottom number   Hypertension During Pregnancy Hypertension is also called high blood pressure. High blood pressure means that the force of your blood moving in your body is too strong. It can cause problems for you and your baby. Different types of high blood pressure can happen during pregnancy. The types are: High blood pressure before you got pregnant. This is called chronic hypertension.  This can continue during your pregnancy. Your doctor will want to keep checking your blood pressure. You may need medicine to keep your blood pressure under control while you are pregnant. You will need follow-up visits after you have your baby. High blood pressure that goes up during pregnancy when it was normal before. This is called gestational hypertension. It will usually get better after you have your baby, but your doctor will need to watch your blood pressure to make sure that it is getting better. Very high blood pressure during pregnancy. This is called preeclampsia. Very high blood pressure is an emergency that needs to be checked and treated right away. You may develop very high blood pressure after giving birth. This is called postpartum preeclampsia. This usually occurs within 48 hours after childbirth but may occur up to 6 weeks after giving birth. This is rare. How does this affect me? If you have high blood pressure during pregnancy, you have a higher chance of developing high blood pressure: As you get older. If you get pregnant again. In some cases, high blood pressure during pregnancy can cause: Stroke. Heart attack. Damage to the kidneys, lungs, or liver. Preeclampsia. Jerky movements you cannot control (convulsions or seizures). Problems with the placenta.  What can I do to lower my risk?  Keep a healthy weight. Eat a healthy  diet. Follow what your doctor tells you about treating any medical problems that you had before becoming pregnant. It is very important to go to all of your doctor visits. Your doctor will check your blood pressure and make sure that your pregnancy is progressing as it should. Treatment should start early if a problem is found.  Follow these instructions at home:  Take your blood pressure 1-2 times per day. Call the office if your blood pressure is 155 or higher for the top number or 105 or higher for the bottom number.    Eating and drinking  Drink enough fluid to keep your pee (urine) pale yellow. Avoid caffeine. Lifestyle Do not use any products that contain nicotine or tobacco, such as cigarettes, e-cigarettes, and chewing tobacco. If you need help quitting, ask your doctor. Do not use alcohol or drugs. Avoid stress. Rest and get plenty of sleep. Regular exercise can help. Ask your doctor what kinds of exercise are best for you. General instructions Take over-the-counter and prescription medicines only as told by your doctor. Keep all prenatal and follow-up visits as told by your doctor. This is important. Contact a doctor if: You have symptoms that your doctor told you to watch for, such as: Headaches. Nausea. Vomiting. Belly (abdominal) pain. Dizziness. Light-headedness. Get help right away if: You have: Very bad belly pain that does not get better with treatment. A very bad headache that does not get better. Vomiting that does not get better. Sudden, fast weight gain. Sudden swelling in your hands, ankles, or face. Blood in your pee. Blurry vision. Double vision.  Shortness of breath. Chest pain. Weakness on one side of your body. Trouble talking. Summary High blood pressure is also called hypertension. High blood pressure means that the force of your blood moving in your body is too strong. High blood pressure can cause problems for you and your baby. Keep all  follow-up visits as told by your doctor. This is important. This information is not intended to replace advice given to you by your health care provider. Make sure you discuss any questions you have with your health care provider. Document Released: 12/17/2010 Document Revised: 03/07/2019 Document Reviewed: 12/11/2018 Elsevier Patient Education  2020 [employer] Inc[employer]   Cesarean Delivery, Care After Refer to this sheet in the next few weeks. These instructions provide you with information on caring for yourself after your procedure. Your health care provider may also give you specific instructions. Your treatment has been planned according to current medical practices, but problems sometimes occur. Call your health care provider if you have any problems or questions after you go home. HOME CARE INSTRUCTIONS   Only take over-the-counter or prescription medications as directed by your health care provider. Do not drink alcohol, especially if you are breastfeeding or taking medication to relieve pain. Do not  smoke tobacco. Continue to use good perineal care. Good perineal care includes: Wiping your perineum from front to back. Keeping your perineum clean. Check your surgical cut (incision) daily for increased redness, drainage, swelling, or separation of skin. Shower and clean your incision gently with soap and water every day, by letting warm and soapy water run over the incision, and then pat it dry. If your health care provider says it is okay, leave the incision uncovered. Use a bandage (dressing) if the incision is draining fluid or appears irritated. If the adhesive strips across the incision do not fall off within 7 days, carefully peel them off, after a shower. Hug a pillow when coughing or sneezing until your incision is healed. This helps to relieve pain. Do not use tampons, douches or have sexual intercourse, until your health care provider says it is okay. Wear a well-fitting bra that  provides breast support. Limit wearing support panties or control-top hose. Drink enough fluids to keep your urine clear or pale yellow. Eat high-fiber foods such as whole grain cereals and breads, brown rice, beans, and fresh fruits and vegetables every day. These foods may help prevent or relieve constipation. Resume activities such as climbing stairs, driving, lifting, exercising, or traveling as directed by your health care provider. Try to have someone help you with your household activities and your newborn for at least a few days after you leave the hospital. Rest as much as possible. Try to rest or take a nap when your newborn is sleeping. Increase your activities gradually. Do not lift more than 15lbs until directed by a provider. Keep all of your scheduled postpartum appointments. It is very important to keep your scheduled follow-up appointments. At these appointments, your health care provider will be checking to make sure that you are healing physically and emotionally. SEEK MEDICAL CARE IF:  You are passing large clots from your vagina. Save any clots to show your health care provider. You have a foul smelling discharge from your vagina. You have trouble urinating. You are urinating frequently. You have pain when you urinate. You have a change in your bowel movements. You have increasing redness, pain, or swelling near your incision. You have pus draining from your incision. Your incision is separating. You  have painful, hard, or reddened breasts. You have a severe headache. You have blurred vision or see spots. You feel sad or depressed. You have thoughts of hurting yourself or your newborn. You have questions about your care, the care of your newborn, or medications. You are dizzy or light-headed. You have a rash. You have pain, redness, or swelling at the site of the removed intravenous access (IV) tube. You have nausea or vomiting. You stopped breastfeeding and have  not had a menstrual period within 12 weeks of stopping. You are not breastfeeding and have not had a menstrual period within 12 weeks of delivery. You have a fever. SEEK IMMEDIATE MEDICAL CARE IF: You have persistent pain. You have chest pain. You have shortness of breath. You faint. You have leg pain. You have stomach pain. Your vaginal bleeding saturates 2 or more sanitary pads in 1 hour. MAKE SURE YOU:  Understand these instructions. Will watch your condition. Will get help right away if you are not doing well or get worse. Document Released: 08/06/2002 Document Revised: 03/31/2014 Document Reviewed: 07/11/2012 [facility] Patient Information 2015 [place], [place]. This information is not intended to replace advice given to you by your health care provider. Make sure you discuss any questions you have with your health care provider.

## 2024-12-10 NOTE — Op Note (Addendum)
 Operative Note   SURGERY DATE: 12/10/2024  PRE-OP DIAGNOSIS:  *Pregnancy at 37/0 *History of cesarean section and desire for repeat *BMI 40s *Severe pre-eclampsia (BPs) superimposed on CHTN *DM2  POST-OP DIAGNOSIS: Same. Delivered   PROCEDURE: Repeat low transverse cesarean section via pfannenstiel skin incision with single layer uterine closure  SURGEON: Surgeons and Role:    * Candice Harari, MD - Primary  ASSISTANT: Candice Angles, MD (OB Fellow)  An experienced assistant was required given the standard of surgical care given the complexity of the case.  This assistant was needed for exposure, dissection, suctioning, retraction, instrument exchange, assisting with delivery with administration of fundal pressure, and for overall help during the procedure.  ANESTHESIA: spinal  ESTIMATED BLOOD LOSS:  DRAINS: 50mL UOP via indwelling foley  TOTAL IV FLUIDS: crystalloid  VTE PROPHYLAXIS: SCDs to bilateral lower extremities  ANTIBIOTICS: Three grams of Cefazolin  were given., within 1 hour of skin incision  SPECIMENS: none  COMPLICATIONS: none  FINDINGS: No intra-abdominal adhesions were noted. Grossly normal uterus, tubes and ovaries. Clear amniotic fluid, cephalic, female infant, weight 6629hf, APGARs 8/9, intact placenta.  PROCEDURE IN DETAIL: The patient was taken to the operating room where anesthesia was administered and normal fetal heart tones were confirmed. She was then prepped and draped in the normal fashion in the dorsal supine position with a leftward tilt.  After a time out was performed, a pfannensteil skin incision was made with the scalpel and carried through to the underlying layer of fascia. The fascia was then incised at the midline and this incision was extended laterally with the mayo scissors. Attention was turned to the superior aspect of the fascial incision which was grasped with the kocher clamps x 2, tented up and the rectus muscles were  dissected off with the scalpel. In a similar fashion the inferior aspect of the fascial incision was grasped with the kocher clamps, tented up and the rectus muscles dissected off with the mayo scissors. The rectus muscles were then separated in the midline and the peritoneum was entered bluntly. The bladder blade was inserted and the vesicouterine peritoneum was identified, tented up and entered with the metzenbaum scissors. This incision was extended laterally and the bladder flap was created digitally. The bladder blade was reinserted.  A low transverse hysterotomy was made with the scalpel until the endometrial cavity was breached and the amniotic sac ruptured, yielding clear amniotic fluid. This incision was extended bluntly and the infants head, shoulders and body were delivered atraumatically.The cord was clamped x 2 and cut, and the infant was handed to the awaiting pediatricians, after delayed cord clamping was done.  The placenta was then gradually expressed from the uterus and then the uterus was exteriorized and cleared of all clots and debris. The hysterotomy was repaired with a running suture of 1-0 monocryl to achieve excellent hemostasis.   The uterus and adnexa were then returned to the abdomen, and the hysterotomy and all operative sites were reinspected and excellent hemostasis was noted after irrigation and suction of the abdomen with warm saline.  The fascia was reapproximated with 0 Vicryl in a simple running fashion bilaterally. The subcutaneous layer was then reapproximated with interrupted sutures of 2-0 plain gut, and the skin was then closed with 4-0 vicryl, in a subcuticular fashion.  The patient  tolerated the procedure well. Sponge, lap, needle, and instrument counts were correct x 2. The patient was transferred to the recovery room awake, alert and breathing independently in stable  condition.  Candice Candice Overcast MD Attending Center for [facility])

## 2024-12-10 NOTE — Discharge Summary (Signed)
 "    Postpartum Discharge Summary  Date of Service updated- 1/16     Patient Name: Candice Campos DOB: [birth year] MRN: [id number]  Date of admission: 12/10/2024 Delivery date:12/10/2024 Delivering provider: IZELL HARARI Date of discharge: 12/13/2024  Admitting diagnosis: Pregnancy at 37/0. Newly diagnosed severe pre-eclampsia (BPs) on L&D admission. CHTN with severe pre-eclampsia. DM2. BMI 40s. History of prior cesarean section  Additional problems: NA    Discharge diagnosis: Term Pregnancy Delivered and Preeclampsia (severe)                                              Post partum procedures:none Augmentation: N/A Complications: None  Hospital course: Patient presented for midnight TOLAC IOL. After d/w her, she elected for a repeat c-section, which was done and uncomplicated. On presentation to L&D, she had normal cbc and cmp with persistent severe range BPs and was started on Mg and given IV anti-hypertensives.  Membrane Rupture Time/Date: 3:40 AM,12/10/2024  Delivery Method:C-Section, Low Transverse Operative Delivery:N/A Details of operation can be found in separate operative note. Patient had a postpartum course was complicated by preeclampsia with severe features.  She received IV magnesium  x 24 hours.  Antihypertensives were titrated as needed and patient was continued on Lasix  for 5 days.  Due to superficial thrombophlebitis, patient was counseled about Lovenox  which she declined.  She is ambulating,tolerating a regular diet, passing flatus, and urinating well.  Patient is discharged home in stable condition 12/13/24.  Newborn Data: Birth date:12/10/2024 Birth time:3:41 AM Gender:Female Living status:Living Apgars:8 ,9  Weight:3370 g  Magnesium  Sulfate received: Yes: Seizure prophylaxis BMZ received: No Rhophylac:No MMR:No T-DaP:offered postpartum Flu: No RSV Vaccine received: No Transfusion:No Immunizations administered: Immunization History  Administered Date(s)  Administered   PFIZER(Purple Top)SARS-COV-2 Vaccination 01/15/2020    Physical exam  Vitals:   12/12/24 2002 12/12/24 2305 12/13/24 0444 12/13/24 0503  BP: (!) 149/85 (!) 148/94 (!) 160/100 (!) 154/91  Pulse: (!) 115 (!) 112 (!) 103 (!) 125  Resp: 19 18 19    Temp: 98.6 F (37 C)  98.6 F (37 C)   TempSrc: Oral  Oral   SpO2: 100% 98% 99%   Weight:      Height:       General: alert, cooperative, and no distress Lochia: appropriate Uterine Fundus: firm Incision: Dressing is clean, dry, and intact DVT Evaluation: No evidence of DVT seen on physical exam. Labs: Lab Results  Component Value Date   WBC 10.3 12/10/2024   HGB 9.5 (L) 12/10/2024   HCT 29.8 (L) 12/10/2024   MCV 75.6 (L) 12/10/2024   PLT 260 12/10/2024      Latest Ref Rng & Units 12/10/2024    7:19 PM  CMP  Glucose 70 - 99 mg/dL 809   BUN 6 - 20 mg/dL 11   Creatinine 9.55 - 1.00 mg/dL 9.35   Sodium 864 - 854 mmol/L 137   Potassium 3.5 - 5.1 mmol/L 3.9   Chloride 98 - 111 mmol/L 106   CO2 22 - 32 mmol/L 20   Calcium 8.9 - 10.3 mg/dL 8.2   Total Protein 6.5 - 8.1 g/dL 6.1   Total Bilirubin 0.0 - 1.2 mg/dL <9.7   Alkaline Phos 38 - 126 U/L 132   AST 15 - 41 U/L 23   ALT 0 - 44 U/L 8    Edinburgh Score:  12/10/2024    6:00 AM  Edinburgh Postnatal Depression Scale Screening Tool  I have been able to laugh and see the funny side of things. 0  I have looked forward with enjoyment to things. 0  I have blamed myself unnecessarily when things went wrong. 0  I have been anxious or worried for no good reason. 0  I have felt scared or panicky for no good reason. 0  Things have been getting on top of me. 0  I have been so unhappy that I have had difficulty sleeping. 0  I have felt sad or miserable. 0  I have been so unhappy that I have been crying. 0  The thought of harming myself has occurred to me. 0  Edinburgh Postnatal Depression Scale Total 0      After visit meds:  Allergies as of 12/13/2024        Reactions   Corticosteroids Palpitations, Other (See Comments)   Psychological reaction   Sertraline Other (See Comments)   Worsened anxiety, hyperhidrosis   Aveeno Baby Bathtime Solutions [johnsons Baby First Touch]    Sever hives and tongue swelled   Amoxicillin Other (See Comments)   Yeast infection        Medication List     STOP taking these medications    aspirin  81 MG chewable tablet   TRUEplus Lancets 33G Misc       TAKE these medications    acetaminophen  325 MG tablet Commonly known as: Tylenol  Take 2 tablets (650 mg total) by mouth every 6 (six) hours as needed.   Blood Pressure Kit Kit Arm circumference- 14.75inches  Ht: 105ft 8 Weight 279lbs  Address- [address]  [place] [address]   Medicaid #[id number]   Cholecalciferol 125 MCG (5000 UT) Tabs Take 1 tablet by mouth daily.   docusate sodium  100 MG capsule Commonly known as: Colace Take 1 capsule (100 mg total) by mouth 2 (two) times daily as needed for up to 20 days for mild constipation.   Easy Max Blood Glucose Test test strip Generic drug: glucose blood Check blood sugar four times daily   enalapril  5 MG tablet Commonly known as: VASOTEC  Take 1 tablet (5 mg total) by mouth daily.   famotidine  20 MG tablet Commonly known as: Pepcid  Take 1 tablet (20 mg total) by mouth 2 (two) times daily.   ferrous sulfate  325 (65 FE) MG tablet Take 1 tablet (325 mg total) by mouth daily with breakfast.   furosemide  20 MG tablet Commonly known as: LASIX  Take 1 tablet (20 mg total) by mouth daily.   metFORMIN  500 MG tablet Commonly known as: GLUCOPHAGE  Take 1 tablet (500 mg total) by mouth 2 (two) times daily with a meal. What changed: how much to take   NIFEdipine  60 MG 24 hr tablet Commonly known as: ADALAT  CC Take 1 tablet (60 mg total) by mouth 2 (two) times daily. What changed: when to take this   norethindrone  0.35 MG tablet Commonly known as: MICRONOR  Take 1 tablet (0.35 mg total) by  mouth daily.   oxyCODONE  5 MG immediate release tablet Commonly known as: Oxy IR/ROXICODONE  Take 1-2 tablets (5-10 mg total) by mouth every 6 (six) hours as needed for up to 7 days for moderate pain (pain score 4-6).   pantoprazole  40 MG tablet Commonly known as: PROTONIX  1 tablet by mouth once daily   potassium chloride  SA 20 MEQ tablet Commonly known as: KLOR-CON  M Take 1 tablet (20 mEq total) by  mouth daily.         Discharge home in stable condition Infant Feeding: Bottle Infant Disposition:home with mother Discharge instruction: per After Visit Summary and Postpartum booklet. Activity: Advance as tolerated. Pelvic rest for 6 weeks.  Diet: routine diet Anticipated Birth Control: POPs Postpartum Appointment:1 week Additional Postpartum F/U: Incision check 1 week and BP check 1 week Future Appointments: Future Appointments  Date Time Provider Department Center  12/18/2024  2:50 PM Kizzie Suzen [place], [place] [facility]  01/14/2025 10:30 AM Kizzie Suzen [place]UN[place], CNM [facility]   Follow up Visit:  Follow-up Information     [facility]. Go in 1 week(s).   Specialty: Obstetrics and Gynecology Why: Follow up in one week for a BP and incision check Contact information: [address] [place]  [address] [phone]                  [X]  1wk incision and bp check message sent on 1/13  12/13/2024 Jhane Lorio M Anyela Napierkowski, DO   "

## 2024-12-10 NOTE — Progress Notes (Signed)
 L&D Note Patient with severe range BPs and she states she took her procardia  60 qday at the usual time (9am on 1/12). I told her that I recommend we start Mg and give her IV meds to help control her BPs. I told her that her chances of having a successful OLAC are low, with approximately 20-25% chance based on MFM VBAC calculator, with probably lower chance given she has severe pre-eclampsia now. I told her that she can still TOLAC as long as she's aware of risk of going through labor and needing a c-section, which is the worst outcome and more stressful for her and baby. Patient unsure about proceeding. I told her we'll start her on the severe pre-eclampsia meds, wait on her labs and scan for presentation and then come back and talk to her.   Bebe Izell Raddle MD Attending Center for [employer] (Faculty Practice) 12/10/2024 Time: [phone]

## 2024-12-10 NOTE — Transfer of Care (Signed)
 Immediate Anesthesia Transfer of Care Note  Patient: Candice Campos  Procedure(s) Performed: CESAREAN DELIVERY  Patient Location: PACU  Anesthesia Type:Spinal  Level of Consciousness: awake, alert , and oriented  Airway & Oxygen Therapy: Patient Spontanous Breathing  Post-op Assessment: Report given to RN and Post -op Vital signs reviewed and stable  Post vital signs: Reviewed and stable  Last Vitals:  Vitals Value Taken Time  BP 137/87 12/10/24 04:34  Temp    Pulse 79 12/10/24 04:40  Resp 16 12/10/24 04:40  SpO2 99 % 12/10/24 04:40  Vitals shown include unfiled device data.  Last Pain:  Vitals:   12/10/24 0146  TempSrc: Oral         Complications: No notable events documented.

## 2024-12-10 NOTE — Anesthesia Postprocedure Evaluation (Signed)
"   Anesthesia Post Note  Patient: Candice Campos  Procedure(s) Performed: CESAREAN DELIVERY     Patient location during evaluation: PACU Anesthesia Type: Spinal Level of consciousness: awake and alert and oriented Pain management: pain level controlled Vital Signs Assessment: post-procedure vital signs reviewed and stable Respiratory status: spontaneous breathing, nonlabored ventilation and respiratory function stable Cardiovascular status: blood pressure returned to baseline and stable Postop Assessment: no headache, no backache, spinal receding and no apparent nausea or vomiting Anesthetic complications: no   No notable events documented.  Last Vitals:  Vitals:   12/10/24 0545 12/10/24 0636  BP: (!) 140/73 (!) 147/88  Pulse: 80 81  Resp: 18 17  Temp: 36.7 C 36.8 C  SpO2: 97% 97%    Last Pain:  Vitals:   12/10/24 0636  TempSrc: Oral  PainSc:    Pain Goal:                   [name]      "

## 2024-12-11 ENCOUNTER — Encounter (HOSPITAL_COMMUNITY): Payer: Self-pay | Admitting: Obstetrics & Gynecology

## 2024-12-11 ENCOUNTER — Telehealth (HOSPITAL_COMMUNITY): Payer: Self-pay | Admitting: Obstetrics and Gynecology

## 2024-12-11 DIAGNOSIS — D509 Iron deficiency anemia, unspecified: Secondary | ICD-10-CM | POA: Insufficient documentation

## 2024-12-11 LAB — GLUCOSE, CAPILLARY
Glucose-Capillary: 80 mg/dL (ref 70–99)
Glucose-Capillary: 130 mg/dL — ABNORMAL HIGH (ref 70–99)
Glucose-Capillary: 104 mg/dL — ABNORMAL HIGH (ref 70–99)
Glucose-Capillary: 135 mg/dL — ABNORMAL HIGH (ref 70–99)

## 2024-12-11 MED ORDER — FERROUS SULFATE 325 (65 FE) MG PO TABS
325.0000 mg | ORAL_TABLET | Freq: Every day | ORAL | Status: DC
  Filled 2024-12-11 (×2): qty 1

## 2024-12-11 MED ORDER — ACETAMINOPHEN 500 MG PO TABS
1000.0000 mg | ORAL_TABLET | Freq: Four times a day (QID) | ORAL | Status: DC
  Administered 2024-12-11 – 2024-12-13 (×9): 1000 mg via ORAL
  Filled 2024-12-11 (×9): qty 2

## 2024-12-11 MED ORDER — NIFEDIPINE ER OSMOTIC RELEASE 60 MG PO TB24
60.0000 mg | ORAL_TABLET | Freq: Two times a day (BID) | ORAL | Status: DC
  Administered 2024-12-11 – 2024-12-13 (×5): 60 mg via ORAL
  Filled 2024-12-11 (×5): qty 1

## 2024-12-11 NOTE — Plan of Care (Signed)
" °  Problem: Education: Goal: Knowledge of General Education information will improve Description: Including pain rating scale, medication(s)/side effects and non-pharmacologic comfort measures Outcome: Progressing   Problem: Health Behavior/Discharge Planning: Goal: Ability to manage health-related needs will improve Outcome: Progressing   Problem: Clinical Measurements: Goal: Ability to maintain clinical measurements within normal limits will improve Outcome: Progressing Goal: Will remain free from infection Outcome: Progressing Goal: Diagnostic test results will improve Outcome: Progressing Goal: Respiratory complications will improve Outcome: Progressing Goal: Cardiovascular complication will be avoided Outcome: Progressing   Problem: Activity: Goal: Risk for activity intolerance will decrease Outcome: Progressing   Problem: Nutrition: Goal: Adequate nutrition will be maintained Outcome: Progressing   Problem: Coping: Goal: Level of anxiety will decrease Outcome: Progressing   Problem: Elimination: Goal: Will not experience complications related to bowel motility Outcome: Progressing Goal: Will not experience complications related to urinary retention Outcome: Progressing   Problem: Pain Managment: Goal: General experience of comfort will improve and/or be controlled Outcome: Progressing   Problem: Safety: Goal: Ability to remain free from injury will improve Outcome: Progressing   Problem: Skin Integrity: Goal: Risk for impaired skin integrity will decrease Outcome: Progressing   Problem: Education: Goal: Knowledge of Childbirth will improve Outcome: Progressing Goal: Ability to make informed decisions regarding treatment and plan of care will improve Outcome: Progressing Goal: Ability to state and carry out methods to decrease the pain will improve Outcome: Progressing Goal: Individualized Educational Video(s) Outcome: Progressing   Problem:  Coping: Goal: Ability to verbalize concerns and feelings about labor and delivery will improve Outcome: Progressing   Problem: Life Cycle: Goal: Ability to make normal progression through stages of labor will improve Outcome: Progressing Goal: Ability to effectively push during vaginal delivery will improve Outcome: Progressing   "

## 2024-12-11 NOTE — Lactation Note (Signed)
 This note was copied from a baby's chart. Lactation Consultation Note  Patient Name: Candice Campos Date: 12/11/2024 Age:33 hours Reason for consult: Follow-up assessment;1st time breastfeeding;Early term 37-38.6wks;Maternal endocrine disorder;Other (Comment) (cHTN, Pre-E)  Visited with family of 10 87/9 weeks old female Candice Campos; Candice Campos is a P2 but this is her first time breastfeeding. She tried with her daughter last year during her 10-day NICU stay at [facility] but things didn't work out. She reported she hasn't had a chance to do any latching or pumping since birth, baby has been doing only feedings of Similac 20 calorie formula.   Explained the importance of consistent nipple stimulation (baby's mouth and/or through pumping) for the onset of secretory activation and the prevention of engorgement. She voiced that baby will be going to daycare and she's interested in getting just the colostrum before fully transitioning to formula. Offered latch assistance but she voiced baby just ate, he was asleep after this circumcision. Asked her to call for latching/pumping assistance when needed.   Feeding Mother's Current Feeding Choice: Breast Milk and Formula Nipple Type: Slow - flow  Lactation Tools Discussed/Used Tools: Pump;Flanges Flange Size: 18 Breast pump type: Double-Electric Breast Pump Pump Education: Setup, frequency, and cleaning;Milk Storage Reason for Pumping: induction of lactation Pumping frequency: has not initiated pumping yet at 36 hours post-partum Pumped volume: 0 mL  Interventions Interventions: Breast feeding basics reviewed;DEBP;Education  Plan STS whenever possible Offer the breast +8 times/24 hours or sooner if feeding cues are present Pump whenever baby is getting a bottle Family will continue supplementing with EBM/Similac 20 calorie formula per feeding choice on admission  No other support person at this time. All questions and concerns  answered, family to contact [facility] services.   Discharge Discharge Education: Engorgement and breast care;Warning signs for feeding baby;Outpatient recommendation Pump: Hands Free;Personal ([employer] insurance pump)  Consult Status Consult Status: PRN Date: 12/12/24 Follow-up type: Call as needed   Candice Campos 12/11/2024, 3:52 PM

## 2024-12-11 NOTE — Progress Notes (Signed)
 Subjective: Postpartum Day 1: Cesarean Delivery Patient reports some incisional pain well controlled with pain medication. She ambulated to the bathroom. She denies chest pain, shortness of breath, lightheadedness/dizziness.  Patient desired newborn circumcision  Objective: Vital signs in last 24 hours: Temp:  [97.6 F (36.4 C)-98.4 F (36.9 C)] 98.1 F (36.7 C) (01/14 0330) Pulse Rate:  [82-109] 94 (01/14 0330) Resp:  [15-20] 20 (01/14 0330) BP: (115-154)/(52-86) 132/67 (01/14 0330) SpO2:  [97 %-100 %] 100 % (01/14 0330)  Physical Exam:  General: alert, cooperative, and no distress Lochia: appropriate Uterine Fundus: firm Incision: honey comb dressing clean and dry DVT Evaluation: No evidence of DVT seen on physical exam.  Recent Labs    12/10/24 0109 12/10/24 1919  HGB 10.0* 9.5*  HCT 32.0* 29.8*    Assessment/Plan:  34 yo POD#1 s/p cesarean section with CHTN superimposed severe preeclampsia - Doing well postoperatively.  - Patient completed magnesium  sulfate for seizure prophylaxis - BP stable on procardia  and enalapril  - Encourage ambulation - Continue current care. - Patient desires newborn circumcision. Circumcision procedure details discussed, risks and benefits of procedure were also discussed.  These include but are not limited to: Benefits of circumcision in men include reduction in the rates of urinary tract infection (UTI), penile cancer, some sexually transmitted infections, penile inflammatory and retractile disorders, as well as easier hygiene.  Risks include bleeding , infection, injury of glans which may lead to penile deformity or urinary tract issues, unsatisfactory cosmetic appearance and other potential complications related to the procedure.  It was emphasized that this is an elective procedure.  Patient wanted to proceed with circumcision; written informed consent obtained.   Winton Felt, MD 12/11/2024, 7:12 AM

## 2024-12-11 NOTE — Telephone Encounter (Signed)
 Patient referred to infusion pharmacy team for ambulatory infusion of IV iron.  Insurance - [place] Medicaid prepaid plan  Site of care - Site of care: [facility] Dx code - D50.9  IV Iron Therapy - Feraheme 510 mg IV x 2   Infusion appointments - Scheduling team will schedule patient as soon as possible.

## 2024-12-11 NOTE — Plan of Care (Signed)
 Problem: Education: Goal: Knowledge of General Education information will improve Description: Including pain rating scale, medication(s)/side effects and non-pharmacologic comfort measures Outcome: Progressing   Problem: Health Behavior/Discharge Planning: Goal: Ability to manage health-related needs will improve Outcome: Progressing   Problem: Clinical Measurements: Goal: Ability to maintain clinical measurements within normal limits will improve Outcome: Progressing Goal: Will remain free from infection Outcome: Progressing Goal: Diagnostic test results will improve Outcome: Progressing Goal: Respiratory complications will improve Outcome: Progressing Goal: Cardiovascular complication will be avoided Outcome: Progressing   Problem: Activity: Goal: Risk for activity intolerance will decrease Outcome: Progressing   Problem: Nutrition: Goal: Adequate nutrition will be maintained Outcome: Progressing   Problem: Coping: Goal: Level of anxiety will decrease Outcome: Progressing   Problem: Elimination: Goal: Will not experience complications related to bowel motility Outcome: Progressing Goal: Will not experience complications related to urinary retention Outcome: Progressing   Problem: Pain Managment: Goal: General experience of comfort will improve and/or be controlled Outcome: Progressing   Problem: Safety: Goal: Ability to remain free from injury will improve Outcome: Progressing   Problem: Skin Integrity: Goal: Risk for impaired skin integrity will decrease Outcome: Progressing   Problem: Education: Goal: Knowledge of Childbirth will improve Outcome: Progressing Goal: Ability to make informed decisions regarding treatment and plan of care will improve Outcome: Progressing Goal: Ability to state and carry out methods to decrease the pain will improve Outcome: Progressing Goal: Individualized Educational Video(s) Outcome: Progressing   Problem:  Coping: Goal: Ability to verbalize concerns and feelings about labor and delivery will improve Outcome: Progressing   Problem: Life Cycle: Goal: Ability to make normal progression through stages of labor will improve Outcome: Progressing Goal: Ability to effectively push during vaginal delivery will improve Outcome: Progressing   Problem: Role Relationship: Goal: Will demonstrate positive interactions with the child Outcome: Progressing   Problem: Safety: Goal: Risk of complications during labor and delivery will decrease Outcome: Progressing   Problem: Pain Management: Goal: Relief or control of pain from uterine contractions will improve Outcome: Progressing   Problem: Education: Goal: Knowledge of disease or condition will improve Outcome: Progressing Goal: Knowledge of the prescribed therapeutic regimen will improve Outcome: Progressing   Problem: Fluid Volume: Goal: Peripheral tissue perfusion will improve Outcome: Progressing   Problem: Clinical Measurements: Goal: Complications related to disease process, condition or treatment will be avoided or minimized Outcome: Progressing   Problem: Education: Goal: Ability to describe self-care measures that may prevent or decrease complications (Diabetes Survival Skills Education) will improve Outcome: Progressing Goal: Individualized Educational Video(s) Outcome: Progressing   Problem: Coping: Goal: Ability to adjust to condition or change in health will improve Outcome: Progressing   Problem: Fluid Volume: Goal: Ability to maintain a balanced intake and output will improve Outcome: Progressing   Problem: Health Behavior/Discharge Planning: Goal: Ability to identify and utilize available resources and services will improve Outcome: Progressing Goal: Ability to manage health-related needs will improve Outcome: Progressing   Problem: Metabolic: Goal: Ability to maintain appropriate glucose levels will  improve Outcome: Progressing   Problem: Nutritional: Goal: Maintenance of adequate nutrition will improve Outcome: Progressing Goal: Progress toward achieving an optimal weight will improve Outcome: Progressing   Problem: Skin Integrity: Goal: Risk for impaired skin integrity will decrease Outcome: Progressing   Problem: Tissue Perfusion: Goal: Adequacy of tissue perfusion will improve Outcome: Progressing   Problem: Education: Goal: Knowledge of the prescribed therapeutic regimen will improve Outcome: Progressing Goal: Understanding of sexual limitations or changes related to disease process or condition  will improve Outcome: Progressing Goal: Individualized Educational Video(s) Outcome: Progressing   Problem: Self-Concept: Goal: Communication of feelings regarding changes in body function or appearance will improve Outcome: Progressing   Problem: Skin Integrity: Goal: Demonstration of wound healing without infection will improve Outcome: Progressing

## 2024-12-11 NOTE — Social Work (Signed)
 CSW received consult for anxiety. CSW met with MOB to offer support and complete assessment.    CSW met with MOB at bedside and introduced CSW role. MOB appeared calm and engaged during the visit. CSW inquired how MOB has been feeling since giving birth. MOB reported that she was   feeling okay. CSW inquired how MOB felt during the pregnancy. MOB reported that she felt good during the pregnancy and denied having any concerns. CSW inquired about MOB's noted mental health history for anxiety. MOB reported that she has a history of anxiety and denied having current anxiety. She reported taking mental health medication in the past to manage her symptoms, however, shared that she no longer requires the medication. CSW inquired if MOB previously experienced postpartum depression and anxiety. MOB denied experiencing postpartum depression and anxiety.   CSW provided education regarding the baby blues period vs. perinatal mood disorders, discussed treatment and offered resources for mental health follow up if concerns arise. MOB politely declined the resources. CSW recommended MOB complete a self-evaluation during the postpartum time period using the New Mom Checklist from Postpartum Progress and encouraged MOB to contact a medical professional if symptoms are noted at any time. CSW assessed MOB for safety. MOB denied SI/HI and DV.   CSW inquired if MOB had all essential items to care for her infant. MOB reported that she has all essential items to care for her infant including a car seat and crib. CSW provided review of Sudden Infant Death Syndrome (SIDS) precautions. MOB verbalized understanding. MOB reported that she has chosen a pediatrician at [facility] and confirmed that she would have transportation.   CSW identifies no further need for intervention and no barriers to discharge at this time.  Nat Quiet, MSW, LCSW Clinical Social Worker  [phone] 12/11/2024  12:34 PM

## 2024-12-12 ENCOUNTER — Telehealth: Payer: Self-pay

## 2024-12-12 LAB — GLUCOSE, CAPILLARY
Glucose-Capillary: 75 mg/dL (ref 70–99)
Glucose-Capillary: 77 mg/dL (ref 70–99)
Glucose-Capillary: 98 mg/dL (ref 70–99)
Glucose-Capillary: 146 mg/dL — ABNORMAL HIGH (ref 70–99)

## 2024-12-12 LAB — BIRTH TISSUE RECOVERY COLLECTION (PLACENTA DONATION)

## 2024-12-12 MED ORDER — ENALAPRIL MALEATE 2.5 MG PO TABS
5.0000 mg | ORAL_TABLET | Freq: Every day | ORAL | Status: DC
  Administered 2024-12-13: 5 mg via ORAL
  Filled 2024-12-12: qty 2

## 2024-12-12 NOTE — Telephone Encounter (Signed)
 Auth Submission: NO AUTH NEEDED Site of care: Site of care: [facility] Payer: [employer] medicaid Medication & CPT/J Code(s) submitted: Feraheme (ferumoxytol) [id number] Diagnosis Code:  Route of submission (phone, fax, portal):  Phone # Fax # Auth type: Buy/Bill PB Units/visits requested: 510mg  x 2 doses Reference number:  Approval from: 12/12/24 to 11/27/25

## 2024-12-12 NOTE — Progress Notes (Signed)
 POSTPARTUM PROGRESS NOTE  POD #2  Subjective:  Candice Campos is a 34 y.o. [id number] s/p rLTCS at [redacted]w[redacted]d. Today she notes she is doing well. She denies any problems with ambulating, voiding or po intake. Denies nausea or vomiting. She has passed flatus, no BM.  Pain is manageable.  Lochia appropriate Denies fever/chills/chest pain/SOB.  no HA, no blurry vision, no RUQ pain  Objective: Blood pressure 128/69, pulse (!) 109, temperature 98.3 F (36.8 C), temperature source Oral, resp. rate 18, height 5' 6 (1.676 m), weight 129.8 kg, SpO2 98%, unknown if currently breastfeeding.  Physical Exam:  General: alert, cooperative and no distress Chest: no respiratory distress Heart: regular rate and rhythm Abdomen: obese, soft, nontender, +BS Uterine Fundus: firm, appropriately tender Incision: honeycomb replaced yesterday- appears clean and dry DVT Evaluation: No calf swelling or tenderness Extremities: no edema Skin: warm, dry  Results for orders placed or performed during the hospital encounter of 12/10/24 (from the past 24 hours)  Glucose, capillary     Status: None   Collection Time: 12/11/24  8:57 AM  Result Value Ref Range   Glucose-Capillary 80 70 - 99 mg/dL  Glucose, capillary     Status: Abnormal   Collection Time: 12/11/24  1:45 PM  Result Value Ref Range   Glucose-Capillary 130 (H) 70 - 99 mg/dL  Glucose, capillary     Status: Abnormal   Collection Time: 12/11/24  7:34 PM  Result Value Ref Range   Glucose-Capillary 104 (H) 70 - 99 mg/dL  Glucose, capillary     Status: Abnormal   Collection Time: 12/11/24  9:38 PM  Result Value Ref Range   Glucose-Capillary 135 (H) 70 - 99 mg/dL    Assessment/Plan: Candice Campos is a 34 y.o. [id number] s/p repeat LTCS at [redacted]w[redacted]d POD#1 complicated by: 1) cHTN with superimposed preE -s/p Mag x 24hr -BP stable on Procardia  60bid and enalpril 2.5mg  daily -Lasix /K ordered -pt asymptomatic  2) Type 2 DM -sugars as above, continue  metformin  500mg  bid  3) Postop -discussed Lovenox  for DVT prevention and encouraged ambulation as tolerated -pain well controlled -meeting milestones appropriately  Contraception: reviewed options- plan for POPs Feeding: bottlefeeding Circ completed  Dispo: Continue routine postop care and as outlined above, plan for discharge home tomorrow   LOS: 2 days   Latrice Storlie, DO Faculty Attending, Center for [facility] 12/12/2024, 7:23 AM

## 2024-12-13 ENCOUNTER — Other Ambulatory Visit

## 2024-12-13 ENCOUNTER — Other Ambulatory Visit (HOSPITAL_COMMUNITY): Payer: Self-pay

## 2024-12-13 ENCOUNTER — Encounter: Payer: Self-pay | Admitting: Pulmonary Disease

## 2024-12-13 LAB — GLUCOSE, CAPILLARY: Glucose-Capillary: 101 mg/dL — ABNORMAL HIGH (ref 70–99)

## 2024-12-13 MED ORDER — ENALAPRIL MALEATE 10 MG PO TABS
5.0000 mg | ORAL_TABLET | Freq: Every day | ORAL | 0 refills | Status: DC
  Filled 2024-12-13: qty 15, 30d supply, fill #0

## 2024-12-13 MED ORDER — POTASSIUM CHLORIDE CRYS ER 20 MEQ PO TBCR
20.0000 meq | EXTENDED_RELEASE_TABLET | Freq: Every day | ORAL | 0 refills | Status: AC
  Filled 2024-12-13: qty 3, 3d supply, fill #0

## 2024-12-13 MED ORDER — NIFEDIPINE ER 60 MG PO TB24
60.0000 mg | ORAL_TABLET | Freq: Two times a day (BID) | ORAL | 0 refills | Status: DC
  Filled 2024-12-13: qty 60, 30d supply, fill #0

## 2024-12-13 MED ORDER — NORETHINDRONE 0.35 MG PO TABS: 1.0000 | ORAL_TABLET | Freq: Every day | ORAL | 4 refills | Status: AC

## 2024-12-13 MED ORDER — METFORMIN HCL 500 MG PO TABS
500.0000 mg | ORAL_TABLET | Freq: Two times a day (BID) | ORAL | 11 refills | Status: AC
  Filled 2024-12-13: qty 120, 60d supply, fill #0

## 2024-12-13 MED ORDER — FUROSEMIDE 20 MG PO TABS
20.0000 mg | ORAL_TABLET | Freq: Every day | ORAL | 0 refills | Status: AC
  Filled 2024-12-13: qty 3, 3d supply, fill #0

## 2024-12-13 MED ORDER — DOCUSATE SODIUM 100 MG PO CAPS
100.0000 mg | ORAL_CAPSULE | Freq: Two times a day (BID) | ORAL | 0 refills | Status: AC | PRN
  Filled 2024-12-13: qty 20, 10d supply, fill #0

## 2024-12-13 MED ORDER — OXYCODONE HCL 5 MG PO TABS
5.0000 mg | ORAL_TABLET | Freq: Four times a day (QID) | ORAL | 0 refills | Status: AC | PRN
  Filled 2024-12-13: qty 26, 4d supply, fill #0

## 2024-12-13 MED ORDER — ACETAMINOPHEN 325 MG PO TABS: 650.0000 mg | ORAL_TABLET | Freq: Four times a day (QID) | ORAL | Status: AC | PRN

## 2024-12-13 MED ORDER — FERROUS SULFATE 325 (65 FE) MG PO TABS
325.0000 mg | ORAL_TABLET | Freq: Every day | ORAL | 0 refills | Status: AC
  Filled 2024-12-13: qty 20, 20d supply, fill #0

## 2024-12-13 MED ORDER — NORETHINDRONE 0.35 MG PO TABS
1.0000 | ORAL_TABLET | Freq: Every day | ORAL | 4 refills | Status: DC
  Filled 2024-12-13: qty 112, 112d supply, fill #0

## 2024-12-13 MED ORDER — ENALAPRIL MALEATE 2.5 MG PO TABS
5.0000 mg | ORAL_TABLET | Freq: Every day | ORAL | 0 refills | Status: DC
  Filled 2024-12-13: qty 60, 30d supply, fill #0

## 2024-12-13 NOTE — Plan of Care (Signed)
Patient to be discharged home with printed instructions. Toya Smothers, RN

## 2024-12-15 ENCOUNTER — Inpatient Hospital Stay (HOSPITAL_COMMUNITY)
Admission: AD | Admit: 2024-12-15 | Discharge: 2024-12-15 | Disposition: A | Attending: Obstetrics and Gynecology | Admitting: Obstetrics and Gynecology

## 2024-12-15 DIAGNOSIS — Z98891 History of uterine scar from previous surgery: Secondary | ICD-10-CM | POA: Diagnosis present

## 2024-12-15 DIAGNOSIS — Z711 Person with feared health complaint in whom no diagnosis is made: Secondary | ICD-10-CM

## 2024-12-15 NOTE — MAU Note (Addendum)
..  Candice Campos is a 34 y.o. at [redacted]w[redacted]d here in MAU reporting: had a c-section on 1/13, reports she had drainage when the dressing was removed while she was in the hospital and was told to keep it dry and keep an eye on it. Today when she got out of the shower she noticed a smell and her husband said she has brown drainage on the dressing.  Denies pain only soreness from incision Pain score: 7/10 Vitals:   12/15/24 0049  BP: 131/64  Pulse: (!) 125  Resp: 16  Temp: 98.8 F (37.1 C)  SpO2: 100%

## 2024-12-15 NOTE — MAU Provider Note (Signed)
 CC/ Drainage from the incision and it is wet with foul odor PO 12/10/24     S/HPI [name] is a 34 y.o. [phone] patient who presents to MAU today with complaint of foul odor coming from her incision and concern for drainage after taking a shower . Denies fever, chills, reports normal incisional pain in PO state     OBJECTIVE BP 131/64 (BP Location: Right Arm)   Pulse (!) 125   Temp 98.8 F (37.1 C) (Oral)   Resp 16   LMP  (LMP Unknown)   SpO2 100%  Physical Exam Vitals and nursing note reviewed.  Constitutional:      General: She is not in acute distress.    Appearance: Normal appearance. She is obese. She is not ill-appearing.  HENT:     Head: Normocephalic.  Abdominal:     General: A surgical scar is present. Bowel sounds are normal.     Palpations: Abdomen is soft.     Tenderness: There is no abdominal tenderness.     Comments: Incision with wet honeycomb dressing and foul odor present  that was removed to visualize incision  Incision is C/D/I  with no drainage visualized  Wound care provided and dry abdominal pad applied   Musculoskeletal:        General: Normal range of motion.  Skin:    General: Skin is warm.  Neurological:     Mental Status: She is alert and oriented to person, place, and time.  Psychiatric:        Mood and Affect: Mood normal.        Behavior: Behavior normal.     MDM  LOW/ Wound care    ASSESSMENT/PLAN Medical screening exam complete  Physically well but worried   Postpartum state -C/S 12/10/24  H/O cesarean section POD # 5 -Incision C/D/I  -Odor likely due to wet dressing and skin to skin secondary to increased body habitus -Instructions reviewed on wound care provided  and hygienic measures   PLAN Future Appointments  Date Time Provider Department Center  12/18/2024  2:50 PM Kizzie Suzen SAUNDERS, CNM [facility]  01/14/2025 10:30 AM Kizzie Suzen SAUNDERS, CNM [facility]    Discharge from MAU in stable  condition  F/U as scheduled   See AVS for full description of educational information and instructions provided to the patient at time of discharge   Warning signs for worsening condition that would warrant emergency follow-up discussed  Patient may return to MAU as needed   Littie Olam LABOR, NP 12/15/2024 3:44 AM

## 2024-12-16 ENCOUNTER — Encounter: Payer: Self-pay | Admitting: Obstetrics & Gynecology

## 2024-12-17 ENCOUNTER — Encounter: Admitting: Women's Health

## 2024-12-17 ENCOUNTER — Other Ambulatory Visit: Admitting: Radiology

## 2024-12-18 ENCOUNTER — Ambulatory Visit: Admitting: Women's Health

## 2024-12-18 ENCOUNTER — Encounter: Payer: Self-pay | Admitting: Women's Health

## 2024-12-18 VITALS — BP 130/90 | HR 103 | Ht 66.0 in | Wt 252.0 lb

## 2024-12-18 DIAGNOSIS — Z4889 Encounter for other specified surgical aftercare: Secondary | ICD-10-CM

## 2024-12-18 DIAGNOSIS — Z48816 Encounter for surgical aftercare following surgery on the genitourinary system: Secondary | ICD-10-CM

## 2024-12-18 DIAGNOSIS — I10 Essential (primary) hypertension: Secondary | ICD-10-CM

## 2024-12-18 DIAGNOSIS — O1003 Pre-existing essential hypertension complicating the puerperium: Secondary | ICD-10-CM

## 2024-12-18 MED ORDER — LISINOPRIL 10 MG PO TABS: 10.0000 mg | ORAL_TABLET | Freq: Every day | ORAL | 3 refills | Status: AC

## 2024-12-18 NOTE — Progress Notes (Signed)
 "  GYN VISIT Patient name: Candice Campos MRN [id number]  Date of birth: 1991-08-22 Chief Complaint:   Postpartum Care  History of Present Illness:   Candice Campos is a 34 y.o. [phone] Caucasian female 8d s/p RCS being seen today for incision and bp check.  CHTN, on nifedipine  60mg  bid and vasotec  5mg  daily. Took both this am. Was on lisinopril  10mg  daily before pregnancy and was well controlled. Thinks incision may be infected, foul smelling drainage, left side open some. Bottlfeeding.  No LMP recorded (lmp unknown).     06/12/2024    9:46 AM  Depression screen PHQ 2/9  Decreased Interest 0  Down, Depressed, Hopeless 0  PHQ - 2 Score 0  Altered sleeping 0  Tired, decreased energy 0  Change in appetite 0  Feeling bad or failure about yourself  0  Trouble concentrating 0  Moving slowly or fidgety/restless 0  Suicidal thoughts 0  PHQ-9 Score 0      Data saved with a previous flowsheet row definition        06/12/2024    9:46 AM  GAD 7 : Generalized Anxiety Score  Nervous, Anxious, on Edge 0   Control/stop worrying 0   Worry too much - different things 0   Trouble relaxing 0   Restless 0   Easily annoyed or irritable 0   Afraid - awful might happen 0   Total GAD 7 Score 0     Data saved with a previous flowsheet row definition     Review of Systems:   Pertinent items are noted in HPI Denies fever/chills, dizziness, headaches, visual disturbances, fatigue, shortness of breath, chest pain, abdominal pain, vomiting, abnormal vaginal discharge/itching/odor/irritation, problems with periods, bowel movements, urination, or intercourse unless otherwise stated above.  Pertinent History Reviewed:  Reviewed past medical,surgical, social, obstetrical and family history.  Reviewed problem list, medications and allergies. Physical Assessment:   Vitals:   12/18/24 1452 12/18/24 1456  BP: (!) 148/95 (!) 130/90  Pulse: (!) 103   Weight: 252 lb (114.3 kg)   Height: 5' 6  (1.676 m)   Body mass index is 40.67 kg/m.       Physical Examination:   General appearance: alert, well appearing, and in no distress  Mental status: alert, oriented to person, place, and time  Skin: warm & dry   Cardiovascular: normal heart rate noted  Respiratory: normal respiratory effort, no distress  Abdomen: soft, non-tender, c/s incision healing well, no erythema or signs of infection, Lt side open just slightly, sutures intact, no obvious drainage/odor. Co-exam w/ LHE, agrees no evidence of infection, recommends applying gentian violet, which I did  Pelvic: examination not indicated  Extremities: no edema   Chaperone: N/A  No results found for this or any previous visit (from the past 24 hours).  Assessment & Plan:  1) 8d s/p RCS> bottlefeeding  2) Incision check> healing well, no evidence of infection, gentian violet applied, keep clean & dry  3) CHTN> wants to go back to lisinopril  10mg  daily she was on pre-pregnancy. To stop nifedipine /vasotec , start lisinopril  10mg  po daily, f/u [facility] for bp check w/ nurse  Meds:  Meds ordered this encounter  Medications   lisinopril  (ZESTRIL ) 10 MG tablet    Sig: Take 1 tablet (10 mg total) by mouth daily.    Dispense:  30 tablet    Refill:  3    No orders of the defined types were placed in this encounter.   Return for  fri am nurse bp check.  Suzen JONELLE Fetters CNM, [facility] 12/18/2024 3:32 PM  "

## 2024-12-19 ENCOUNTER — Encounter: Payer: Self-pay | Admitting: Women's Health

## 2024-12-20 ENCOUNTER — Other Ambulatory Visit

## 2024-12-20 ENCOUNTER — Ambulatory Visit

## 2024-12-20 VITALS — BP 134/82 | HR 86

## 2024-12-20 DIAGNOSIS — I10 Essential (primary) hypertension: Secondary | ICD-10-CM | POA: Diagnosis not present

## 2024-12-20 DIAGNOSIS — Z0130 Encounter for examination of blood pressure without abnormal findings: Secondary | ICD-10-CM | POA: Diagnosis not present

## 2024-12-20 NOTE — Progress Notes (Signed)
" ° °  NURSE VISIT- BLOOD PRESSURE CHECK  SUBJECTIVE:  Candice Campos is a 34 y.o. [phone] female here for BP check. She is postpartum, delivery date 12/10/24    HYPERTENSION ROS:  Pregnant/postpartum:  Severe headaches that don't go away with tylenol /other medicines: No  Visual changes (seeing spots/double/blurred vision) No  Severe pain under right breast breast or in center of upper chest No  Severe nausea/vomiting No  Taking medicines as instructed yes   OBJECTIVE:  BP 134/82 (BP Location: Right Arm, Patient Position: Sitting, Cuff Size: Large)   Pulse 86   LMP  (LMP Unknown)   Appearance alert, well appearing, and in no distress.  ASSESSMENT: Postpartum  blood pressure check  PLAN: Discussed with Candice Daring, NP   Recommendations: no changes needed   Follow-up: as scheduled   Candice Campos  12/20/2024 8:46 AM  "

## 2024-12-22 ENCOUNTER — Encounter: Payer: Self-pay | Admitting: Obstetrics & Gynecology

## 2024-12-24 ENCOUNTER — Other Ambulatory Visit

## 2024-12-24 ENCOUNTER — Encounter: Admitting: Obstetrics & Gynecology

## 2024-12-26 ENCOUNTER — Encounter: Payer: Self-pay | Admitting: Obstetrics & Gynecology

## 2024-12-27 ENCOUNTER — Other Ambulatory Visit

## 2024-12-28 ENCOUNTER — Other Ambulatory Visit: Payer: Self-pay

## 2024-12-28 ENCOUNTER — Emergency Department (HOSPITAL_COMMUNITY)

## 2024-12-28 ENCOUNTER — Emergency Department (HOSPITAL_COMMUNITY)
Admission: EM | Admit: 2024-12-28 | Discharge: 2024-12-28 | Disposition: A | Discharge status: Home / Self Care | Attending: Emergency Medicine | Admitting: Emergency Medicine

## 2024-12-28 DIAGNOSIS — I10 Essential (primary) hypertension: Secondary | ICD-10-CM | POA: Diagnosis not present

## 2024-12-28 LAB — URINALYSIS, ROUTINE W REFLEX MICROSCOPIC
Bilirubin Urine: NEGATIVE
Glucose, UA: NEGATIVE mg/dL
Ketones, ur: NEGATIVE mg/dL
Nitrite: NEGATIVE
Protein, ur: 30 mg/dL — AB
Specific Gravity, Urine: 1.02 (ref 1.005–1.030)
pH: 5 (ref 5.0–8.0)

## 2024-12-28 LAB — CBC WITH DIFFERENTIAL/PLATELET
Abs Immature Granulocytes: 0.03 10*3/uL (ref 0.00–0.07)
Basophils Absolute: 0 10*3/uL (ref 0.0–0.1)
Basophils Relative: 1 %
Eosinophils Absolute: 0.1 10*3/uL (ref 0.0–0.5)
Eosinophils Relative: 1 %
HCT: 35 % — ABNORMAL LOW (ref 36.0–46.0)
Hemoglobin: 10.7 g/dL — ABNORMAL LOW (ref 12.0–15.0)
Immature Granulocytes: 1 %
Lymphocytes Relative: 21 %
Lymphs Abs: 1.4 10*3/uL (ref 0.7–4.0)
MCH: 24 pg — ABNORMAL LOW (ref 26.0–34.0)
MCHC: 30.6 g/dL (ref 30.0–36.0)
MCV: 78.5 fL — ABNORMAL LOW (ref 80.0–100.0)
Monocytes Absolute: 0.3 10*3/uL (ref 0.1–1.0)
Monocytes Relative: 4 %
Neutro Abs: 4.8 10*3/uL (ref 1.7–7.7)
Neutrophils Relative %: 72 %
Platelets: 392 10*3/uL (ref 150–400)
RBC: 4.46 MIL/uL (ref 3.87–5.11)
RDW: 15.3 % (ref 11.5–15.5)
WBC: 6.7 10*3/uL (ref 4.0–10.5)
nRBC: 0 % (ref 0.0–0.2)

## 2024-12-28 LAB — COMPREHENSIVE METABOLIC PANEL WITH GFR
ALT: 26 U/L (ref 0–44)
AST: 21 U/L (ref 15–41)
Albumin: 3.8 g/dL (ref 3.5–5.0)
Alkaline Phosphatase: 124 U/L (ref 38–126)
Anion gap: 15 (ref 5–15)
BUN: 9 mg/dL (ref 6–20)
CO2: 23 mmol/L (ref 22–32)
Calcium: 9.1 mg/dL (ref 8.9–10.3)
Chloride: 105 mmol/L (ref 98–111)
Creatinine, Ser: 0.65 mg/dL (ref 0.44–1.00)
GFR, Estimated: >60 mL/min
Glucose, Bld: 94 mg/dL (ref 70–99)
Potassium: 3.8 mmol/L (ref 3.5–5.1)
Sodium: 142 mmol/L (ref 135–145)
Total Bilirubin: 0.2 mg/dL (ref 0.0–1.2)
Total Protein: 7 g/dL (ref 6.5–8.1)

## 2024-12-28 LAB — PREGNANCY, URINE: Preg Test, Ur: NEGATIVE

## 2024-12-28 MED ORDER — GADOBUTROL 1 MMOL/ML IV SOLN
10.0000 mL | Freq: Once | INTRAVENOUS | Status: AC | PRN
  Administered 2024-12-28: 10 mL via INTRAVENOUS

## 2024-12-28 MED ORDER — KETOROLAC TROMETHAMINE 15 MG/ML IJ SOLN
15.0000 mg | Freq: Once | INTRAMUSCULAR | Status: AC
  Administered 2024-12-28: 15 mg via INTRAVENOUS
  Filled 2024-12-28: qty 1

## 2024-12-28 MED ORDER — LISINOPRIL 10 MG PO TABS
10.0000 mg | ORAL_TABLET | Freq: Once | ORAL | Status: AC
  Administered 2024-12-28: 10 mg via ORAL
  Filled 2024-12-28: qty 1

## 2024-12-28 MED ORDER — ACETAMINOPHEN 500 MG PO TABS
1000.0000 mg | ORAL_TABLET | Freq: Once | ORAL | Status: AC
  Administered 2024-12-28: 1000 mg via ORAL
  Filled 2024-12-28: qty 2

## 2024-12-28 MED ORDER — PROCHLORPERAZINE EDISYLATE 10 MG/2ML IJ SOLN: 10.0000 mg | Freq: Once | INTRAMUSCULAR | Status: DC

## 2024-12-28 MED ORDER — DIPHENHYDRAMINE HCL 50 MG/ML IJ SOLN
25.0000 mg | Freq: Once | INTRAMUSCULAR | Status: DC
  Filled 2024-12-28 (×2): qty 1

## 2024-12-28 NOTE — Consult Note (Signed)
" ° °  OB/GYN Telephone Consult  @DATE @  Candice Campos is a 34 y.o. [phone] who presented to [facility] due to headache    The provider had a clinical question about management   The provider presented the following relevant clinical information: -recently delivered with known chronic HTN- took home medication, but still noted to have elevated BP as well as headache -provider called regarding work-up and management  I performed a chart review on the patient and reviewed available documentation.  -pt with h/o severe preeclampsia diagnosed during L&D- s/p Magneisum and discharged home on lisinopril  -recently seen in our office BP 130/80s on Lisinopril  10mg  daily  BP (!) 154/95   Pulse 72   Temp 98.8 F (37.1 C) (Oral)   Resp 16   Ht 5' 6 (1.676 m)   Wt 114.3 kg   LMP  (LMP Unknown)   SpO2 100%   BMI 40.67 kg/m   Exam- performed by consulting provider   Recommendations:  -advise CBC, CMP which are currently pending -BP not in severe range, admission not indicated -would add additional BP medication and treat HA per usual medication  -Recommended MD/APP provide the patient with a referral to the Center for [facility] (any office) for follow up in  1 week.   Thank you for this consult and if additional recommendations are needed please call [phone] for the OB/GYN attending on service at [facility].   I spent approximately 5 minutes directly consulting with the provider and verbally discussing this case. Additionally 10 minutes minutes was spent performing chart review and documentation.   Krystianna Soth, DO Attending Obstetrician & Gynecologist, [facility], [facility]   Criteria for phone consult billing? (If answer to any of these are yes then you cannot bill this telephone consult) Will the patient be seen urgently (within 24hrs) at a [facility] practice? No Is this a patient on which I performed surgery  within the last 7d? No Have you billed a telephone consult on this patient in the last 7d? No    "

## 2024-12-28 NOTE — ED Provider Notes (Signed)
 Received from previous provider please refer to previous note for full HPI.  In short patient is presenting for hypertension postpartum.  Patient is established with OB/GYN and they advised if labs and workup are unremarkable increase patient's lisinopril  to 20 mg daily and they will see her in office this week.  Pending MRI results patient will be discharged with increased to 20 mg daily of lisinopril . Physical Exam  BP (!) 155/90   Pulse 63   Temp 98.8 F (37.1 C) (Oral)   Resp 18   Ht 5' 6 (1.676 m)   Wt 114.3 kg   LMP  (LMP Unknown)   SpO2 100%   BMI 40.67 kg/m   Physical Exam Vitals and nursing note reviewed.  Constitutional:      General: She is not in acute distress.    Appearance: Normal appearance. She is well-developed. She is not ill-appearing or toxic-appearing.  HENT:     Head: Normocephalic and atraumatic.     Nose: Nose normal.  Eyes:     Extraocular Movements: Extraocular movements intact.     Conjunctiva/sclera: Conjunctivae normal.     Pupils: Pupils are equal, round, and reactive to light.  Cardiovascular:     Rate and Rhythm: Normal rate.  Pulmonary:     Effort: Pulmonary effort is normal. No respiratory distress.  Musculoskeletal:        General: Normal range of motion.     Cervical back: Normal range of motion.  Neurological:     General: No focal deficit present.     Mental Status: She is alert.  Psychiatric:        Mood and Affect: Mood normal.        Behavior: Behavior normal.     Procedures  Procedures  ED Course / MDM     MRI is unremarkable.  Patient has no new symptoms on exam and is sitting comfortably in ED bed.  Patient was advised to follow-up with OB/GYN this week and she agreed.  Adjustments to lisinopril  were made and patient agreed.  Labs are overall unremarkable UA notable for small amount of leukocytes, patient has no UTI symptoms.  Patient was advised of strict return precautions and agreed to treatment plan.  Patient  comfortable discharge at this time.    Portions of this note were generated with Scientist, clinical (histocompatibility and immunogenetics). Dictation errors may occur despite best attempts at proofreading.    Myriam Fonda RAMAN, PA-C 12/28/24 2254    Elnor Jayson LABOR, DO 01/03/25 1452

## 2024-12-28 NOTE — ED Notes (Signed)
 Pt refused the IV and did not want the IV meds

## 2024-12-28 NOTE — ED Provider Notes (Cosign Needed Addendum)
 " [place] EMERGENCY DEPARTMENT AT [facility] Provider Note   CSN: [id number] Arrival date & time: 12/28/24  1333     Patient presents with: Headache   Candice Campos is a 34 y.o. female.  She has G2, P2, history of prediabetes and hypertension, she is on lisinopril  10 mg today for high blood pressure.  Presents to the ER today for evaluation of 2 separate complaints. First, for the past 2 days she has had some small amount of blood when changing her C-section bandaging but denies any heavy bleeding or dizziness, she said she cannot see it well and is unsure if it has opened up.  Second is that she has had a headache since yesterday, states it feels like pressure in the frontal area and some mild pressure in the back of her head, no vision changes, no fevers, no confusion, no nausea or vomiting.  She took Tylenol  yesterday and states that seemed to improve some but today the headache has been constant.  She has not taken any medications today for her headache.  She states blood pressures tend to run in the 130s, though towards the end of her pregnancy were 140s up to 150.  Denies lower extremity swelling, no abdominal pain.    Headache      Prior to Admission medications  Medication Sig Start Date End Date Taking? Authorizing Provider  acetaminophen  (TYLENOL ) 325 MG tablet Take 2 tablets (650 mg total) by mouth every 6 (six) hours as needed. 12/13/24   Ozan, Jennifer, DO  Blood Pressure Monitoring (BLOOD PRESSURE KIT) KIT Arm circumference- 14.75inches  Ht: 56ft 8 Weight 279lbs  Address- [address]  [place] [address]   Medicaid #[id number] 07/07/23   [provider]  Cholecalciferol 125 MCG (5000 UT) TABS Take 1 tablet by mouth daily. Patient not taking: Reported on 12/18/2024 02/15/24   [provider]  docusate sodium  (COLACE) 100 MG capsule Take 1 capsule (100 mg total) by mouth 2 (two) times daily as needed for up to 20 days for mild constipation.  12/13/24 01/02/25  Ozan, Jennifer, DO  famotidine  (PEPCID ) 20 MG tablet Take 1 tablet (20 mg total) by mouth 2 (two) times daily. Patient not taking: Reported on 12/18/2024 06/26/24 12/03/24  Ozan, Jennifer, DO  ferrous sulfate  325 (65 FE) MG tablet Take 1 tablet (325 mg total) by mouth daily with breakfast. Patient not taking: Reported on 12/18/2024 12/13/24   Ozan, Jennifer, DO  furosemide  (LASIX ) 20 MG tablet Take 1 tablet (20 mg total) by mouth daily. Patient not taking: Reported on 12/18/2024 12/13/24 12/16/24  Ozan, Jennifer, DO  glucose blood (EASY MAX BLOOD GLUCOSE TEST) test strip Check blood sugar four times daily 08/13/24   Jayne Vonn DEL, MD  lisinopril  (ZESTRIL ) 10 MG tablet Take 1 tablet (10 mg total) by mouth daily. 12/18/24   Kizzie Suzen SAUNDERS, CNM  metFORMIN  (GLUCOPHAGE ) 500 MG tablet Take 1 tablet (500 mg total) by mouth 2 (two) times daily with a meal. 12/13/24 01/12/25  Ozan, Jennifer, DO  norethindrone  (MICRONOR ) 0.35 MG tablet Take 1 tablet (0.35 mg total) by mouth daily. Patient not taking: Reported on 12/18/2024 12/13/24 03/13/25  Ozan, Jennifer, DO  pantoprazole  (PROTONIX ) 40 MG tablet 1 tablet by mouth once daily 09/24/24   Kizzie Suzen SAUNDERS, CNM  potassium chloride  SA (KLOR-CON  M) 20 MEQ tablet Take 1 tablet (20 mEq total) by mouth daily. Patient not taking: Reported on 12/18/2024 12/13/24   Ozan, Jennifer, DO  Allergies: Corticosteroids, Sertraline, Aveeno baby bathtime solutions [johnsons baby first touch], and Amoxicillin    Review of Systems  Neurological:  Positive for headaches.    Updated Vital Signs BP (!) 155/90   Pulse 63   Temp 98.8 F (37.1 C) (Oral)   Resp 18   Ht 5' 6 (1.676 m)   Wt 114.3 kg   LMP  (LMP Unknown)   SpO2 100%   BMI 40.67 kg/m   Physical Exam Vitals and nursing note reviewed.  Constitutional:      General: She is not in acute distress.    Appearance: She is well-developed.  HENT:     Head: Normocephalic and atraumatic.  Eyes:      Extraocular Movements: Extraocular movements intact.     Right eye: No nystagmus.     Left eye: No nystagmus.     Conjunctiva/sclera: Conjunctivae normal.     Pupils: Pupils are equal, round, and reactive to light.  Cardiovascular:     Rate and Rhythm: Normal rate and regular rhythm.     Heart sounds: No murmur heard. Pulmonary:     Effort: Pulmonary effort is normal. No respiratory distress.     Breath sounds: Normal breath sounds.  Abdominal:     Palpations: Abdomen is soft.     Tenderness: There is no abdominal tenderness.     Comments: No dehiscence of C-section, no bleeding  Musculoskeletal:        General: No swelling.     Cervical back: Neck supple.  Skin:    General: Skin is warm and dry.     Capillary Refill: Capillary refill takes less than 2 seconds.  Neurological:     Mental Status: She is alert.     Cranial Nerves: No cranial nerve deficit.     Sensory: No sensory deficit.     Motor: No weakness.     Gait: Gait normal.  Psychiatric:        Mood and Affect: Mood normal.     (all labs ordered are listed, but only abnormal results are displayed) Labs Reviewed  CBC WITH DIFFERENTIAL/PLATELET - Abnormal; Notable for the following components:      Result Value   Hemoglobin 10.7 (*)    HCT 35.0 (*)    MCV 78.5 (*)    MCH 24.0 (*)    All other components within normal limits  URINALYSIS, ROUTINE W REFLEX MICROSCOPIC - Abnormal; Notable for the following components:   Hgb urine dipstick MODERATE (*)    Protein, ur 30 (*)    Leukocytes,Ua SMALL (*)    Bacteria, UA RARE (*)    All other components within normal limits  COMPREHENSIVE METABOLIC PANEL WITH GFR  PREGNANCY, URINE    EKG: None  Radiology: No results found.   Procedures   Medications Ordered in the ED  acetaminophen  (TYLENOL ) tablet 1,000 mg (1,000 mg Oral Given 12/28/24 1520)  ketorolac  (TORADOL ) 15 MG/ML injection 15 mg (15 mg Intravenous Given 12/28/24 1556)  lisinopril  (ZESTRIL ) tablet 10  mg (10 mg Oral Given 12/28/24 1732)                                    Medical Decision Making This patient presents to the ED for concern of headache, this involves an extensive number of treatment options, and is a complaint that carries with it a high risk of complications and morbidity.  The differential  diagnosis includes headache, migraine, venous sinus embolus, postpartum preeclampsia, other   Co morbidities that complicate the patient evaluation :   Obesity, preeclampsia, hypertension, prediabetes   Additional history obtained:  Additional history obtained from EMR External records from outside source obtained and reviewed including previous notes and labs   Lab Tests:  I Ordered, and personally interpreted labs.  The pertinent results include: CBC with he will 10.7 which is trending up from discharge after delivery, CMP is normal, UA shows 30 mg/dL of protein, small leukocytes, 11-20 whites and rare bacteria.  She is not having any urinary symptoms.   Imaging Studies ordered:  I ordered imaging studies including MRI/MRV which is pending I independently visualized and interpreted imaging within scope of identifying emergent findings  I agree with the radiologist interpretation   Consultations Obtained:  I requested consultation with the OB/GYN Dr. Ozan,  and discussed lab and imaging findings as well as pertinent plan - they recommend: If the labs are reassuring and patient's pain is improved, we can give adjust BP meds, if improving can follow-up in within 1 week for blood pressure check  Problem List / ED Course / Critical interventions / Medication management  Headache, patient having headache for the past week, worse the past couple days has been more constant today, significantly improved after Tylenol  and Toradol .  Patient requesting to leave but while initial blood pressure recheck show blood pressure 131/73, recheck prior to discharge showed blood pressure had  increased again, will give additional 10 mg of lisinopril  and recheck.  Patient reports headache is 3 out of 10, discussed while waiting for blood pressure to improve, given that she is postpartum causing hypercoagulability and having different than usual headaches will obtain MRV.  She is agreeable with this.  Signed out to [employer], PA-C I ordered medication including Tylenol  and Toradol  for, patient declined Benadryl , Reglan eating she does not like how they make her feel. Reevaluation of the patient after these medicines showed that the patient improved I have reviewed the patients home medicines and have made adjustments as needed       Amount and/or Complexity of Data Reviewed Labs: ordered. Radiology: ordered.  Risk OTC drugs. Prescription drug management.        Final diagnoses:  None    ED Discharge Orders     None          Suellen Sherran DELENA DEVONNA 12/28/24 1849    Suellen Sherran DELENA DEVONNA 12/28/24 1857    Suellen Sherran DELENA DEVONNA 12/28/24 1905  "

## 2024-12-28 NOTE — Discharge Instructions (Addendum)
 Lab work imaging and exam were reassuring today.  It was advised by your OB/GYN to increase your lisinopril  to 20 mg daily, that will be 2 pills.  They will follow-up with you in office this week.  Please call them to establish an appointment.  If there are any concerning new or worsening symptoms please return to the emergency department for further evaluation.

## 2024-12-28 NOTE — ED Triage Notes (Addendum)
 Pt had c section on 12/10/24 and yesterday. Pt states she called OB doc and they said it was ok but it is still bleeding. PT states she changes dressing over c section 2 times a day but it is not a lot of blood. Also complains of headache in back of head that started 2 days ago. Headache 8/10. No medication taken OTC prior to coming to ED

## 2024-12-31 ENCOUNTER — Encounter: Admitting: Obstetrics & Gynecology

## 2024-12-31 ENCOUNTER — Other Ambulatory Visit: Admitting: Radiology

## 2025-01-02 ENCOUNTER — Ambulatory Visit

## 2025-01-02 VITALS — BP 154/92

## 2025-01-02 DIAGNOSIS — I10 Essential (primary) hypertension: Secondary | ICD-10-CM

## 2025-01-02 DIAGNOSIS — Z0130 Encounter for examination of blood pressure without abnormal findings: Secondary | ICD-10-CM

## 2025-01-02 NOTE — Progress Notes (Signed)
" ° °  NURSE VISIT- BLOOD PRESSURE CHECK  SUBJECTIVE:  Candice Campos is a 34 y.o. [phone] female here for BP check. She is postpartum, delivery date 12/10/24    HYPERTENSION ROS:  Postpartum:  Severe headaches that don't go away with tylenol /other medicines: No  Visual changes (seeing spots/double/blurred vision) No  Severe pain under right breast breast or in center of upper chest No  Severe nausea/vomiting No  Taking medicines as instructed yes  OBJECTIVE:  BP (!) 154/92 (BP Location: Right Arm) Comment: office cuff  LMP  (LMP Unknown)  145/90 left arm office cuff, 154/91 right arm home cuff, 160/101 left arm home cuff Appearance alert, well appearing, and in no distress.  ASSESSMENT: Postpartum  blood pressure check  PLAN: Discussed with Dr. Ozan   Recommendations: no changes needed   Follow-up: in 1 week   Candice Campos  01/02/2025 10:14 AM  "

## 2025-01-09 ENCOUNTER — Ambulatory Visit

## 2025-01-14 ENCOUNTER — Ambulatory Visit: Admitting: Women's Health
